# Patient Record
Sex: Female | Born: 1960
Health system: Southern US, Community
[De-identification: ages and names within clinical notes are randomized; demographics above are authoritative.]

## PROBLEM LIST (undated history)

## (undated) DIAGNOSIS — I10 Essential (primary) hypertension: Secondary | ICD-10-CM

## (undated) DIAGNOSIS — E119 Type 2 diabetes mellitus without complications: Secondary | ICD-10-CM

## (undated) DIAGNOSIS — E785 Hyperlipidemia, unspecified: Secondary | ICD-10-CM

## (undated) DIAGNOSIS — Z5189 Encounter for other specified aftercare: Secondary | ICD-10-CM

## (undated) DIAGNOSIS — M1990 Unspecified osteoarthritis, unspecified site: Secondary | ICD-10-CM

## (undated) DIAGNOSIS — F17210 Nicotine dependence, cigarettes, uncomplicated: Secondary | ICD-10-CM

## (undated) DIAGNOSIS — M4186 Other forms of scoliosis, lumbar region: Secondary | ICD-10-CM

## (undated) DIAGNOSIS — M4156 Other secondary scoliosis, lumbar region: Secondary | ICD-10-CM

## (undated) DIAGNOSIS — Z803 Family history of malignant neoplasm of breast: Secondary | ICD-10-CM

## (undated) HISTORY — DX: Encounter for other specified aftercare: Z51.89

## (undated) HISTORY — DX: Unspecified osteoarthritis, unspecified site: M19.90

## (undated) HISTORY — PX: ABDOMINAL HYSTERECTOMY: SHX81

## (undated) HISTORY — DX: Family history of malignant neoplasm of breast: Z80.3

## (undated) HISTORY — DX: Other secondary scoliosis, lumbar region: M41.56

## (undated) HISTORY — DX: Hyperlipidemia, unspecified: E78.5

## (undated) HISTORY — DX: Other forms of scoliosis, lumbar region: M41.86

## (undated) HISTORY — DX: Type 2 diabetes mellitus without complications: E11.9

---
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
## 2017-04-17 ENCOUNTER — Emergency Department (HOSPITAL_BASED_OUTPATIENT_CLINIC_OR_DEPARTMENT_OTHER)
Admission: EM | Admit: 2017-04-17 | Discharge: 2017-04-17 | Disposition: A | Discharge status: Home / Self Care | Payer: Self-pay | Attending: Emergency Medicine | Admitting: Emergency Medicine

## 2017-04-17 ENCOUNTER — Encounter (HOSPITAL_BASED_OUTPATIENT_CLINIC_OR_DEPARTMENT_OTHER): Payer: Self-pay | Admitting: Emergency Medicine

## 2017-04-17 DIAGNOSIS — S39012A Strain of muscle, fascia and tendon of lower back, initial encounter: Secondary | ICD-10-CM | POA: Insufficient documentation

## 2017-04-17 DIAGNOSIS — Y9389 Activity, other specified: Secondary | ICD-10-CM | POA: Insufficient documentation

## 2017-04-17 DIAGNOSIS — Y929 Unspecified place or not applicable: Secondary | ICD-10-CM | POA: Insufficient documentation

## 2017-04-17 DIAGNOSIS — Y990 Civilian activity done for income or pay: Secondary | ICD-10-CM | POA: Insufficient documentation

## 2017-04-17 DIAGNOSIS — X500XXA Overexertion from strenuous movement or load, initial encounter: Secondary | ICD-10-CM | POA: Insufficient documentation

## 2017-04-17 MED ORDER — KETOROLAC TROMETHAMINE 30 MG/ML IJ SOLN
30.0000 mg | Freq: Once | INTRAMUSCULAR | Status: AC
  Administered 2017-04-17: 30 mg via INTRAMUSCULAR
  Filled 2017-04-17: qty 1

## 2017-04-17 MED ORDER — DEXAMETHASONE SODIUM PHOSPHATE 10 MG/ML IJ SOLN
10.0000 mg | Freq: Once | INTRAMUSCULAR | Status: AC
  Administered 2017-04-17: 10 mg via INTRAMUSCULAR
  Filled 2017-04-17: qty 1

## 2017-04-17 MED ORDER — CYCLOBENZAPRINE HCL 10 MG PO TABS: 10.0000 mg | ORAL_TABLET | Freq: Every day | ORAL | 0 refills | Status: DC

## 2017-04-17 MED ORDER — IBUPROFEN 800 MG PO TABS: 800.0000 mg | ORAL_TABLET | Freq: Three times a day (TID) | ORAL | 0 refills | Status: DC | PRN

## 2017-04-17 MED ORDER — TRAMADOL HCL 50 MG PO TABS: 50.0000 mg | ORAL_TABLET | Freq: Four times a day (QID) | ORAL | 0 refills | Status: DC | PRN

## 2017-04-17 NOTE — ED Triage Notes (Signed)
PT presents with c/o right lower back pain going down thru buttocks.

## 2017-04-17 NOTE — ED Notes (Signed)
ED PA at BS 

## 2017-04-17 NOTE — ED Notes (Signed)
EDPA into room, prior to RN assessment, see PA notes, pending orders.   

## 2017-04-17 NOTE — ED Notes (Signed)
Alert, NAD, calm, interactive, resps e/u, speaking in clear complete sentences, no dyspnea noted, skin W&D, c/o R sided low back pain, "may have aggravated/injured lifting heavy box 5 days ago, and again this am, (denies: numbness, tingling, radiation, bowel or bladder sx, urinary sx,fever, NVD, bleeding, saddle paresthesia, sob, dizziness or visual changes). No h/o same.

## 2017-04-17 NOTE — Discharge Instructions (Signed)
Return here as needed. Follow up with your doctor. °

## 2017-04-19 NOTE — ED Provider Notes (Signed)
[facility] EM[facility]GENC[facility] DEPART[facility] Provider Note   CSN: [id number] Arrival date & time: 04/17/17  2135     History   Chief Complaint Chief Complaint  Patient presents with  . Back Pain    HPI Janice Jacobs is a 56 y.o. female.  HPI Patient presents to the emergency department with right lower back pain that started 3 days ago.  Patient states that the pain does seem to go into her right buttocks.  Patient states that certain movements and palpation make the pain worse.  She states that 3 days ago she was at work and lifted a heavy box and twisted and felt a pulling in her lower back.  Patient states that she was at work today when she lifted another box that was heavy and felt increasing pain.  Patient denies numbness weakness dizziness headache blurred vision History reviewed. No pertinent past medical history.  There are no active problems to display for this patient.   History reviewed. No pertinent surgical history.  OB History    No data available       Home Medications    Prior to Admission medications   Medication Sig Start Date End Date Taking? Authorizing Provider  cyclobenzaprine (FLEXERIL) 10 MG tablet Take 1 tablet (10 mg total) by mouth at bedtime. 04/17/17   Prateek Knipple, Harrell Gave, PA-C  ibuprofen (ADVIL,MOTRIN) 800 MG tablet Take 1 tablet (800 mg total) by mouth every 8 (eight) hours as needed. 04/17/17   Cherlyn Syring, Harrell Gave, PA-C  traMADol (ULTRAM) 50 MG tablet Take 1 tablet (50 mg total) by mouth every 6 (six) hours as needed for severe pain. 04/17/17   Dalia Heading, PA-C    Family History No family history on file.  Social History Social History   Tobacco Use  . Smoking status: Never Smoker  . Smokeless tobacco: Never Used  Substance Use Topics  . Alcohol use: No  . Drug use: No     Allergies   Patient has no known allergies.   Review of Systems Review of Systems  All other systems negative except as documented in the HPI. All  pertinent positives and negatives as reviewed in the HPI. Physical Exam Updated Vital Signs BP (!) 188/94 (BP Location: Left Arm)   Pulse 70   Temp 98.8 F (37.1 C) (Oral)   Resp 19   SpO2 100%   Physical Exam  Constitutional: She is oriented to person, place, and time. She appears well-developed and well-nourished. No distress.  HENT:  Head: Normocephalic and atraumatic.  Eyes: Pupils are equal, round, and reactive to light.  Pulmonary/Chest: Effort normal.  Musculoskeletal:       Back:  Neurological: She is alert and oriented to person, place, and time. She has normal strength. No sensory deficit. Coordination and gait normal. GCS eye subscore is 4. GCS verbal subscore is 5. GCS motor subscore is 6.  Skin: Skin is warm and dry.  Psychiatric: She has a normal mood and affect.  Nursing note and vitals reviewed.    ED Treatments / Results  Labs (all labs ordered are listed, but only abnormal results are displayed) Labs Reviewed - No data to display  EKG  EKG Interpretation None       Radiology No results found.  Procedures Procedures (including critical care time)  Medications Ordered in ED Medications  ketorolac (TORADOL) 30 MG/ML injection 30 mg (30 mg Intramuscular Given 04/17/17 2244)  dexamethasone (DECADRON) injection 10 mg (10 mg Intramuscular Given 04/17/17 2244)  Initial Impression / Assessment and Plan / ED Course  I have reviewed the triage vital signs and the nursing notes.  Pertinent labs & imaging results that were available during my care of the patient were reviewed by me and considered in my medical decision making (see chart for details).     Patient has a lumbar strain based on her physical exam and HPI.  She will be treated for this told to ice and use heat on her back told to avoid heavy lifting over the next week.  Patient agrees the plan and all questions were answered.  The patient has no neurological deficits noted on exam  Final  Clinical Impressions(s) / ED Diagnoses   Final diagnoses:  Strain of lumbar region, initial encounter    ED Discharge Orders        Ordered    ibuprofen (ADVIL,MOTRIN) 800 MG tablet  Every 8 hours PRN     04/17/17 2228    traMADol (ULTRAM) 50 MG tablet  Every 6 hours PRN     04/17/17 2228    cyclobenzaprine (FLEXERIL) 10 MG tablet  Daily at bedtime     04/17/17 2228       Dalia Heading, PA-C 04/19/17 0109    Lajean Saver, MD 04/19/17 1043

## 2017-09-18 ENCOUNTER — Encounter (HOSPITAL_BASED_OUTPATIENT_CLINIC_OR_DEPARTMENT_OTHER): Payer: Self-pay | Admitting: *Deleted

## 2017-09-18 ENCOUNTER — Other Ambulatory Visit: Payer: Self-pay

## 2017-09-18 DIAGNOSIS — K029 Dental caries, unspecified: Secondary | ICD-10-CM | POA: Insufficient documentation

## 2017-09-18 DIAGNOSIS — K0889 Other specified disorders of teeth and supporting structures: Secondary | ICD-10-CM | POA: Diagnosis present

## 2017-09-18 DIAGNOSIS — F17200 Nicotine dependence, unspecified, uncomplicated: Secondary | ICD-10-CM | POA: Insufficient documentation

## 2017-09-18 NOTE — ED Triage Notes (Signed)
Upper dental pain for "a while". States she has had difficulty eating today. She has tried home measures without relief

## 2017-09-19 ENCOUNTER — Emergency Department (HOSPITAL_BASED_OUTPATIENT_CLINIC_OR_DEPARTMENT_OTHER)
Admission: EM | Admit: 2017-09-19 | Discharge: 2017-09-19 | Disposition: A | Discharge status: Home / Self Care | Payer: Commercial Managed Care - PPO | Attending: Emergency Medicine | Admitting: Emergency Medicine

## 2017-09-19 DIAGNOSIS — K0889 Other specified disorders of teeth and supporting structures: Secondary | ICD-10-CM

## 2017-09-19 DIAGNOSIS — K029 Dental caries, unspecified: Secondary | ICD-10-CM

## 2017-09-19 MED ORDER — KETOROLAC TROMETHAMINE 60 MG/2ML IM SOLN
60.0000 mg | Freq: Once | INTRAMUSCULAR | Status: AC
  Administered 2017-09-19: 60 mg via INTRAMUSCULAR
  Filled 2017-09-19: qty 2

## 2017-09-19 MED ORDER — PENICILLIN V POTASSIUM 250 MG PO TABS
500.0000 mg | ORAL_TABLET | Freq: Once | ORAL | Status: AC
  Administered 2017-09-19: 500 mg via ORAL
  Filled 2017-09-19: qty 2

## 2017-09-19 MED ORDER — PENICILLIN V POTASSIUM 500 MG PO TABS: 500.0000 mg | ORAL_TABLET | Freq: Three times a day (TID) | ORAL | 0 refills | Status: DC

## 2017-09-19 MED ORDER — HYDROCODONE-ACETAMINOPHEN 5-325 MG PO TABS
1.0000 | ORAL_TABLET | Freq: Once | ORAL | Status: AC
  Administered 2017-09-19: 1 via ORAL
  Filled 2017-09-19: qty 1

## 2017-09-19 MED ORDER — IBUPROFEN 800 MG PO TABS: 800.0000 mg | ORAL_TABLET | Freq: Three times a day (TID) | ORAL | 0 refills | Status: DC

## 2017-09-19 NOTE — ED Provider Notes (Signed)
[facility] EMER[facility] DEPARTMENT Provider Note   CSN: [id number] Arrival date & time: 09/18/17  2301     History   Chief Complaint Chief Complaint  Patient presents with  . Dental Pain    HPI Janice Jacobs is a 57 y.o. female.  The history is provided by the patient.  Dental Pain   This is a chronic problem. The current episode started more than 1 week ago. The problem occurs constantly. The problem has been gradually worsening. The pain is mild. She has tried acetaminophen for the symptoms. The treatment provided mild relief.    History reviewed. No pertinent past medical history.  There are no active problems to display for this patient.   Past Surgical History:  Procedure Laterality Date  . ABDOMINAL HYSTERECTOMY       OB History   None      Home Medications    Prior to Admission medications   Medication Sig Start Date End Date Taking? Authorizing Provider  ibuprofen (ADVIL,MOTRIN) 800 MG tablet Take 1 tablet (800 mg total) by mouth 3 (three) times daily. 09/19/17   Channon Ambrosini, Corene Cornea, MD  penicillin v potassium (VEETID) 500 MG tablet Take 1 tablet (500 mg total) by mouth 3 (three) times daily. 09/19/17   Mellody Masri, Corene Cornea, MD    Family History No family history on file.  Social History Social History   Tobacco Use  . Smoking status: Current Every Day Smoker  . Smokeless tobacco: Never Used  Substance Use Topics  . Alcohol use: No  . Drug use: No     Allergies   Patient has no known allergies.   Review of Systems Review of Systems  All other systems reviewed and are negative.    Physical Exam Updated Vital Signs BP (!) 170/93 (BP Location: Right Arm)   Pulse 68   Temp 98 F (36.7 C) (Oral)   Resp 18   Ht 5' (1.524 m)   Wt 83.2 kg (183 lb 6.8 oz)   SpO2 100%   BMI 35.82 kg/m   Physical Exam  Constitutional: She appears well-developed and well-nourished.  HENT:  Head: Normocephalic and atraumatic.  Mouth/Throat:    Eyes:  Conjunctivae and EOM are normal.  Neck: Normal range of motion.  Cardiovascular: Normal rate and regular rhythm.  Pulmonary/Chest: No stridor. No respiratory distress.  Abdominal: Soft. She exhibits no distension.  Neurological: She is alert.  Skin: Skin is warm and dry.  Nursing note and vitals reviewed.    ED Treatments / Results  Labs (all labs ordered are listed, but only abnormal results are displayed) Labs Reviewed - No data to display  EKG None  Radiology No results found.  Procedures Procedures (including critical care time)  Medications Ordered in ED Medications  ketorolac (TORADOL) injection 60 mg (60 mg Intramuscular Given 09/19/17 0126)  HYDROcodone-acetaminophen (NORCO/VICODIN) 5-325 MG per tablet 1 tablet (1 tablet Oral Given 09/19/17 0126)  penicillin v potassium (VEETID) tablet 500 mg (500 mg Oral Given 09/19/17 0126)     Initial Impression / Assessment and Plan / ED Course  I have reviewed the triage vital signs and the nursing notes.  Pertinent labs & imaging results that were available during my care of the patient were reviewed by me and considered in my medical decision making (see chart for details).     Suspect tooth infection. Abx/nsaids/dental follow up (already working on it). No e/o drainable abscess, airway comprommise or ludwigs.   Final Clinical Impressions(s) / ED  Diagnoses   Final diagnoses:  Pain, dental  Dental caries    ED Discharge Orders        Ordered    ibuprofen (ADVIL,MOTRIN) 800 MG tablet  3 times daily     09/19/17 0119    penicillin v potassium (VEETID) 500 MG tablet  3 times daily     09/19/17 0119       Navina Wohlers, Corene Cornea, MD 09/19/17 0134

## 2017-09-19 NOTE — ED Notes (Signed)
Alert, NAD, calm, interactive, resps e/u, speaking in clear complete sentences, no dyspnea noted, skin W&D, c/o R upper first molar fx & pain. Ongoing for >2 weeks. Does not have dentist yet. No relief with ibuprofen last taken at 2000. Denies: fever, NV, dizziness or drainage.

## 2017-09-19 NOTE — ED Notes (Signed)
Steady gait to room. Alert, NAD, calm, interactive, resps e/u, speaking in clear complete sentences, no dyspnea noted.

## 2018-02-08 ENCOUNTER — Encounter (HOSPITAL_BASED_OUTPATIENT_CLINIC_OR_DEPARTMENT_OTHER): Payer: Self-pay | Admitting: Emergency Medicine

## 2018-02-08 ENCOUNTER — Other Ambulatory Visit: Payer: Self-pay

## 2018-02-08 ENCOUNTER — Emergency Department (HOSPITAL_BASED_OUTPATIENT_CLINIC_OR_DEPARTMENT_OTHER)
Admission: EM | Admit: 2018-02-08 | Discharge: 2018-02-08 | Disposition: A | Discharge status: Home / Self Care | Payer: Commercial Managed Care - PPO | Attending: Emergency Medicine | Admitting: Emergency Medicine

## 2018-02-08 DIAGNOSIS — F17210 Nicotine dependence, cigarettes, uncomplicated: Secondary | ICD-10-CM | POA: Insufficient documentation

## 2018-02-08 DIAGNOSIS — I10 Essential (primary) hypertension: Secondary | ICD-10-CM

## 2018-02-08 DIAGNOSIS — N390 Urinary tract infection, site not specified: Secondary | ICD-10-CM

## 2018-02-08 DIAGNOSIS — A599 Trichomoniasis, unspecified: Secondary | ICD-10-CM | POA: Insufficient documentation

## 2018-02-08 HISTORY — DX: Essential (primary) hypertension: I10

## 2018-02-08 LAB — URINALYSIS, ROUTINE W REFLEX MICROSCOPIC
Bilirubin Urine: NEGATIVE
Glucose, UA: NEGATIVE mg/dL
Ketones, ur: NEGATIVE mg/dL
Nitrite: NEGATIVE
Protein, ur: NEGATIVE mg/dL
Specific Gravity, Urine: 1.01 (ref 1.005–1.030)
pH: 7 (ref 5.0–8.0)

## 2018-02-08 LAB — URINALYSIS, MICROSCOPIC (REFLEX)

## 2018-02-08 MED ORDER — METRONIDAZOLE 500 MG PO TABS
2000.0000 mg | ORAL_TABLET | Freq: Once | ORAL | Status: AC
  Administered 2018-02-08: 2000 mg via ORAL
  Filled 2018-02-08: qty 4

## 2018-02-08 MED ORDER — ACETAMINOPHEN 325 MG PO TABS
650.0000 mg | ORAL_TABLET | Freq: Once | ORAL | Status: AC
  Administered 2018-02-08: 650 mg via ORAL
  Filled 2018-02-08: qty 2

## 2018-02-08 MED ORDER — CEPHALEXIN 500 MG PO CAPS: 500.0000 mg | ORAL_CAPSULE | Freq: Two times a day (BID) | ORAL | 0 refills | Status: AC

## 2018-02-08 MED FILL — CEPHALEXIN 500 MG CAPSULE: 500 | 5 days supply | Qty: 10 | Fill #0

## 2018-02-08 NOTE — Discharge Instructions (Addendum)
Your urine showed that you have a UTI and trichomonas. You have been treated for trichomonas with Flagyl in the emergency room. You do not need to take any more medication for this. For the UTI, I have prescribed Keflex (an antibiotic) for you to take for 5 days. Please take this for the full five (5) days even if you start to feel better.  Your blood pressure was elevated today. It is important that you take your blood pressure medication and establish regular medical follow-up with a PCP. I have listed a primary clinic here in the building that you can establish care with.  Take care of yourself!

## 2018-02-08 NOTE — ED Triage Notes (Signed)
Reports suprapubic pain and lower back pain x 1 month.  Taking azo which gave her some relief at first but states it is no longer providing relief.  Denies N/V/D, hematuria, dysuria.

## 2018-02-08 NOTE — ED Provider Notes (Signed)
[facility] EMERGENCY DEPARTMENT Provider Note  CSN: [id number] Arrival date & time: 02/08/18  1051    History   Chief Complaint Chief Complaint  Patient presents with  . Abdominal Pain    HPI Janice Jacobs is a 57 y.o. female with a medical history of HTN who presented to the ED for low back and lower abdominal pain x30 days. She describes dull, aching pain in suprapubic region and low back bilaterally. She states that the pain is worse when she is lying down and better when standing up. No associated changes in pain with eating, defecation or times of day. Denies fever, chills, N/V, diarrhea, constipation, melena/hematochezia, dysuria, hematuria, frequency, vaginal pain, vaginal discharge or vaginal bleeding. Patient states that she has not been sexually active in 5-6 years.She states she has tried Tylenol prior to coming to the ED which help with the pain. She has not sought medical care for this prior to today.  Past Medical History:  Diagnosis Date  . Hypertension     There are no active problems to display for this patient.   Past Surgical History:  Procedure Laterality Date  . ABDOMINAL HYSTERECTOMY       OB History   None      Home Medications    Prior to Admission medications   Medication Sig Start Date End Date Taking? Authorizing Provider  cephALEXin (KEFLEX) 500 MG capsule Take 1 capsule (500 mg total) by mouth 2 (two) times daily for 5 days. 02/08/18 02/13/18  Rad Gramling, Alvie Heidelberg I, PA-C  ibuprofen (ADVIL,MOTRIN) 800 MG tablet Take 1 tablet (800 mg total) by mouth 3 (three) times daily. 09/19/17   Mesner, Corene Cornea, MD  lisinopril (PRINIVIL,ZESTRIL) 2.5 MG tablet Take 2.5 mg by mouth daily.    [provider]  penicillin v potassium (VEETID) 500 MG tablet Take 1 tablet (500 mg total) by mouth 3 (three) times daily. 09/19/17   Mesner, Corene Cornea, MD    Family History History reviewed. No pertinent family history.  Social History Social History   Tobacco  Use  . Smoking status: Current Every Day Smoker    Packs/day: 0.50    Types: Cigarettes  . Smokeless tobacco: Never Used  Substance Use Topics  . Alcohol use: No  . Drug use: No     Allergies   Patient has no known allergies.   Review of Systems Review of Systems  Constitutional: Negative for chills and fever.  Gastrointestinal: Positive for abdominal pain. Negative for blood in stool, constipation, diarrhea, nausea, rectal pain and vomiting.  Genitourinary: Negative for difficulty urinating, dysuria, frequency, hematuria, urgency, vaginal bleeding, vaginal discharge and vaginal pain.  Musculoskeletal: Positive for back pain. Negative for gait problem and neck pain.  Skin: Negative.   Hematological: Negative.      Physical Exam Updated Vital Signs BP (!) 183/100 (BP Location: Left Arm)   Pulse 95   Temp 98.8 F (37.1 C) (Oral)   Resp 18   Ht 5' (1.524 m)   Wt 80 kg   SpO2 96%   BMI 34.45 kg/m   Physical Exam  Constitutional: She appears well-developed and well-nourished.  Cardiovascular: Normal rate, regular rhythm and normal heart sounds.  Pulmonary/Chest: Effort normal and breath sounds normal.  Abdominal: Soft. Normal appearance and bowel sounds are normal. There is tenderness in the suprapubic area.  Musculoskeletal: Normal range of motion.       Lumbar back: She exhibits tenderness. She exhibits normal range of motion, no bony tenderness and  no spasm.       Back:  Skin: Skin is warm and intact. Capillary refill takes less than 2 seconds.  Nursing note and vitals reviewed.  ED Treatments / Results  Labs (all labs ordered are listed, but only abnormal results are displayed) Labs Reviewed  URINALYSIS, ROUTINE W REFLEX MICROSCOPIC - Abnormal; Notable for the following components:      Result Value   Hgb urine dipstick TRACE (*)    Leukocytes, UA SMALL (*)    All other components within normal limits  URINALYSIS, MICROSCOPIC (REFLEX) - Abnormal; Notable for  the following components:   Bacteria, UA MANY (*)    Trichomonas, UA PRESENT (*)    All other components within normal limits    EKG None  Radiology No results found.  Procedures Procedures (including critical care time)  Medications Ordered in ED Medications  metroNIDAZOLE (FLAGYL) tablet 2,000 mg (2,000 mg Oral Given 02/08/18 1227)  acetaminophen (TYLENOL) tablet 650 mg (650 mg Oral Given 02/08/18 1227)     Initial Impression / Assessment and Plan / ED Course  Triage vital signs and the nursing notes have been reviewed.  Pertinent labs & imaging results that were available during care of the patient were reviewed and considered in medical decision making (see chart for details).  Patient presents with a 30 day history of bilateral low back and suprapubic pain. Patient is afebrile and well appearing. She has no other associated symptoms and her physical exam was grossly unremarkable except for suprapubic discomfort and right side lumbar muscle tenderness. She has no risk factors that put her at risk for STIs. History and physical not consistent with an acute abdominal pathology such as pancreatitis, diverticulitis or appendicitis. Likely GU etiology.  Clinical Course as of Feb 09 1236  Tue Feb 08, 2018  1152 UA suggestive of UTI and trichomonas.   [GM]  1228 Patient hypertensive at 183/100. She reports being prescribed antihypertensive, but states that she does not take it. She endorses not liking to follow-up with medical providers. No s/s of end organ damage that warrant further evaluation. Advised to continue taking antihypertensive.   [GM]    Clinical Course User Index [GM] Seymore Brodowski, Jonelle Sports, PA-C    Final Clinical Impressions(s) / ED Diagnoses  1. UTI. Keflex 500mg  BID x5 days prescribed. 2. Trichomonas. Flagyl 2g x1 given in the ED. 3. Hypertension. Education provided on HTN and importance of medication compliance. Advised to follow-up with PCP for further  management.  Dispo: Home. After thorough clinical evaluation, this patient is determined to be medically stable and can be safely discharged with the previously mentioned treatment and/or outpatient follow-up/referral(s). At this time, there are no other apparent medical conditions that require further screening, evaluation or treatment.   Final diagnoses:  Urinary tract infection without hematuria, site unspecified  Infection due to trichomonas  Hypertension, unspecified type    ED Discharge Orders         Ordered    cephALEXin (KEFLEX) 500 MG capsule  2 times daily     02/08/18 1237            Sanita Estrada, [place]in[place] I, PA-C 02/08/18 [place], [place], DO 02/08/18 1548

## 2018-02-15 ENCOUNTER — Telehealth: Payer: Self-pay | Admitting: Medical

## 2018-02-15 ENCOUNTER — Encounter: Payer: Self-pay | Admitting: Medical

## 2018-02-15 ENCOUNTER — Ambulatory Visit (HOSPITAL_BASED_OUTPATIENT_CLINIC_OR_DEPARTMENT_OTHER)
Admission: RE | Admit: 2018-02-15 | Discharge: 2018-02-15 | Disposition: A | Discharge status: Home / Self Care | Payer: Commercial Managed Care - PPO | Source: Ambulatory Visit | Attending: Medical | Admitting: Medical

## 2018-02-15 ENCOUNTER — Ambulatory Visit (INDEPENDENT_AMBULATORY_CARE_PROVIDER_SITE_OTHER): Payer: Commercial Managed Care - PPO | Admitting: Medical

## 2018-02-15 VITALS — BP 148/88 | HR 77 | Temp 98.6°F | Resp 16 | Ht 60.0 in | Wt 175.8 lb

## 2018-02-15 DIAGNOSIS — R8271 Bacteriuria: Secondary | ICD-10-CM

## 2018-02-15 DIAGNOSIS — I10 Essential (primary) hypertension: Secondary | ICD-10-CM

## 2018-02-15 DIAGNOSIS — F17200 Nicotine dependence, unspecified, uncomplicated: Secondary | ICD-10-CM

## 2018-02-15 DIAGNOSIS — G8929 Other chronic pain: Secondary | ICD-10-CM | POA: Insufficient documentation

## 2018-02-15 DIAGNOSIS — M545 Low back pain: Secondary | ICD-10-CM

## 2018-02-15 DIAGNOSIS — E785 Hyperlipidemia, unspecified: Secondary | ICD-10-CM

## 2018-02-15 LAB — COMPREHENSIVE METABOLIC PANEL
ALBUMIN: 4.2 g/dL (ref 3.5–5.2)
ALT: 9 U/L (ref 0–35)
AST: 10 U/L (ref 0–37)
Alkaline Phosphatase: 71 U/L (ref 39–117)
BUN: 12 mg/dL (ref 6–23)
CO2: 28 mEq/L (ref 19–32)
CREATININE: 0.75 mg/dL (ref 0.40–1.20)
Calcium: 9.6 mg/dL (ref 8.4–10.5)
Chloride: 102 mEq/L (ref 96–112)
GFR: 103.25 mL/min (ref >60.00)
GLUCOSE: 96 mg/dL (ref 70–99)
POTASSIUM: 4.8 meq/L (ref 3.5–5.1)
SODIUM: 138 meq/L (ref 135–145)
Total Bilirubin: 0.3 mg/dL (ref 0.2–1.2)
Total Protein: 7.1 g/dL (ref 6.0–8.3)

## 2018-02-15 LAB — POC URINALSYSI DIPSTICK (AUTOMATED)
Bilirubin, UA: NEGATIVE
Blood, UA: NEGATIVE
Glucose, UA: NEGATIVE
KETONES UA: NEGATIVE
Leukocytes, UA: NEGATIVE
Nitrite, UA: NEGATIVE
PH UA: 6 (ref 5.0–8.0)
PROTEIN UA: NEGATIVE
SPEC GRAV UA: 1.015 (ref 1.010–1.025)
UROBILINOGEN UA: NEGATIVE U/dL — AB

## 2018-02-15 LAB — LIPID PANEL
CHOLESTEROL: 171 mg/dL (ref 0–200)
HDL: 37.1 mg/dL — ABNORMAL LOW (ref >39.00)
LDL Cholesterol: 114 mg/dL — ABNORMAL HIGH (ref 0–99)
NONHDL: 134.15
Total CHOL/HDL Ratio: 5
Triglycerides: 99 mg/dL (ref 0.0–149.0)
VLDL: 19.8 mg/dL (ref 0.0–40.0)

## 2018-02-15 MED ORDER — ATORVASTATIN CALCIUM 10 MG PO TABS: 10.0000 mg | ORAL_TABLET | Freq: Every day | ORAL | 3 refills | Status: DC

## 2018-02-15 NOTE — Patient Instructions (Addendum)
Your bp did start to come down with your bp medication. Important to keep bp controlled all the time so take med daily. Check daily over next 10-14 days. Then will decide if need increased dose.  For high lipid hx, please get cmp and lipid panel today.  Counseled on benefit to quite smoking. Will offer med to stop when bp is better controlled.  For back pain, get xray lumbar spine. Can use salon pas lidocaine patch and tylenol. Might be able to add nsaid in future visit when bp better controlled.  For recent ED visit and bacteria in urine will get urine culture. Will not do ancillary studies as you took flagyl.  Follow up in 10-14 days or as needed

## 2018-02-15 NOTE — Addendum Note (Signed)
Addended by: Hinton Dyer on: 02/15/2018 06:08 PM   Modules accepted: Orders

## 2018-02-15 NOTE — Telephone Encounter (Signed)
poct urine and urine culture done.

## 2018-02-15 NOTE — Telephone Encounter (Signed)
I saw pt as lunch started. She gave urine poct. Can you run that and associate with bacteria in urine. Also will you do urine culture. By time she gave sample you were at lunch. I did lable and it should still be in bathroom.

## 2018-02-15 NOTE — Telephone Encounter (Signed)
Atorvastatin sent to pt pharmacy.

## 2018-02-15 NOTE — Progress Notes (Signed)
Subjective:    Patient ID: Janice Jacobs, female    DOB: 12-21-1960, 57 y.o.   MRN: [id number]  HPI  Pt in for first time.     Pt works at [employer], [place] does not exercise daily but she is on her feet all day long. 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 Pt does not drink caffeine beverage. She smokes half pack new ports. Smoking since 57 yo. Did quit when she was pregnant with 3 daughters. Pt admits does not moderate healthy diet. Eats occasional red meat. No pork.    She appears to have consistent high bp levels over past visit in epic. No cardiac or neurologic signs or symptoms.  Pt recently evaluated at the ED. She was dx with uti and trichomonas. She states no sex in past 5 years.  Pt took keflex and flagyl.  She had a lot of bacteria. But no culture was done.  She had mid lumbar back pain for about 3 months. She states 3 month ago apply icey hot but did not take any medication for this. Also was taking tylenol for pain.    Pt has history of high cholesterol. Pt given lipid medications in past. Has not been on medication for 3 years.      Review of Systems  Constitutional: Negative for chills, fatigue and fever.  Respiratory: Negative for chest tightness, shortness of breath and wheezing.   Cardiovascular: Negative for chest pain and palpitations.  Gastrointestinal: Negative for abdominal pain.  Musculoskeletal: Positive for back pain. Negative for myalgias and neck pain.       Bilateral knee pain. Rt knee pain is worst. Declines xray of knee today.   Neurological: Negative for dizziness, syncope, weakness and headaches.  Hematological: Negative for adenopathy. Does not bruise/bleed easily.  Psychiatric/Behavioral: Negative for behavioral problems and confusion.   Past Medical History:  Diagnosis Date  . Hyperlipidemia   . Hypertension      Social History   Socioeconomic History  . Marital status: Divorced    Spouse name: Not on file  . Number of children: Not  on file  . Years of education: Not on file  . Highest education level: Not on file  Occupational History  . Not on file  Social Needs  . Financial resource strain: Not on file  . Food insecurity:    Worry: Not on file    Inability: Not on file  . Transportation needs:    Medical: Not on file    Non-medical: Not on file  Tobacco Use  . Smoking status: Current Every Day Smoker    Packs/day: 0.50    Types: Cigarettes  . Smokeless tobacco: Never Used  Substance and Sexual Activity  . Alcohol use: No  . Drug use: No  . Sexual activity: Never  Lifestyle  . Physical activity:    Days per week: Not on file    Minutes per session: Not on file  . Stress: Not on file  Relationships  . Social connections:    Talks on phone: Not on file    Gets together: Not on file    Attends religious service: Not on file    Active member of club or organization: Not on file    Attends meetings of clubs or organizations: Not on file    Relationship status: Not on file  . Intimate partner violence:    Fear of current or ex partner: Not on file    Emotionally abused:  Not on file    Physically abused: Not on file    Forced sexual activity: Not on file  Other Topics Concern  . Not on file  Social History Narrative  . Not on file    Past Surgical History:  Procedure Laterality Date  . ABDOMINAL HYSTERECTOMY      History reviewed. No pertinent family history.  No Known Allergies  Current Outpatient Medications on File Prior to Visit  Medication Sig Dispense Refill  . lisinopril (PRINIVIL,ZESTRIL) 2.5 MG tablet Take 2.5 mg by mouth daily.     No current facility-administered medications on file prior to visit.     BP (!) 148/88   Pulse 77   Temp 98.6 F (37 C) (Oral)   Resp 16   Ht 5' (1.524 m)   Wt 175 lb 12.8 oz (79.7 kg)   SpO2 100%   BMI 34.33 kg/m       Objective:   Physical Exam   General Mental Status- Alert. General Appearance- Not in acute distress.    Skin General: Color- Normal Color. Moisture- Normal Moisture.  Neck Carotid Arteries- Normal color. Moisture- Normal Moisture. No carotid bruits. No JVD.  Chest and Lung Exam Auscultation: Breath Sounds:-Normal.  Cardiovascular Auscultation:Rythm- Regular. Murmurs & Other Heart Sounds:Auscultation of the heart reveals- No Murmurs.  Abdomen Inspection:-Inspeection Normal. Palpation/Percussion:Note:No mass. Palpation and Percussion of the abdomen reveal- Non Tender, Non Distended + BS, no rebound or guarding.   Neurologic Cranial Nerve exam:- CN III-XII intact(No nystagmus), symmetric smile. Strength:- 5/5 equal and symmetric strength both upper and lower extremities.  Back- mid lumbar tenderness to palpation. No pain on straight leg lift. No cva pain on palpation.  Knees- bilateral crepitus on flexion and extension.        Assessment & Plan:  Your bp did start to come down with your bp medication. Important to keep bp controlled all the time so take med daily. Check daily over next 10-14 days. Then will decide if need increased dose.  For high lipid hx, please get cmp and lipid panel today.  Counseled on benefit to quite smoking. Will offer med to stop when bp is better controlled.  For back pain, get xray lumbar spine. Can use salon pas lidocaine patch and tylenol. Might be able to add nsaid in future visit when bp better controlled.  For recent ED visit and bacteria in urine will get urine culture. Will not do ancillary studies as you took flagyl.  Follow up in 10-14 days or as needed

## 2018-02-17 LAB — URINE CULTURE
MICRO NUMBER:: 91055803
SPECIMEN QUALITY:: ADEQUATE

## 2018-02-18 ENCOUNTER — Telehealth: Payer: Self-pay | Admitting: Medical

## 2018-02-18 NOTE — Telephone Encounter (Unsigned)
Copied from [place] [id number]. Topic: Quick Communication - Rx Refill/Question >> Feb 18, 2018  4:47 PM Neva Seat wrote: atorvastatin (LIPITOR) 10 MG tablet  Out of refills - needing refills asap  [facility] [phone], [place] AT [place] [address] [place] [address] Phone: [phone] Fax: [phone]

## 2018-03-01 ENCOUNTER — Ambulatory Visit: Payer: Commercial Managed Care - PPO | Admitting: Medical

## 2018-03-01 DIAGNOSIS — Z0289 Encounter for other administrative examinations: Secondary | ICD-10-CM

## 2018-03-29 ENCOUNTER — Encounter: Payer: Self-pay | Admitting: Medical

## 2019-01-16 ENCOUNTER — Other Ambulatory Visit: Payer: Self-pay

## 2019-01-16 DIAGNOSIS — Z20822 Contact with and (suspected) exposure to covid-19: Secondary | ICD-10-CM

## 2019-01-17 LAB — NOVEL CORONAVIRUS, NAA: SARS-CoV-2, NAA: NOT DETECTED

## 2019-01-18 ENCOUNTER — Telehealth: Payer: Self-pay | Admitting: Medical

## 2019-01-18 NOTE — Telephone Encounter (Signed)
Pt was given covid-19(not detected) result/ Pt verbalized understanding

## 2019-04-12 ENCOUNTER — Encounter: Payer: Self-pay | Admitting: Adult Health Nurse Practitioner

## 2019-04-12 ENCOUNTER — Other Ambulatory Visit: Payer: Self-pay

## 2019-04-12 ENCOUNTER — Ambulatory Visit (INDEPENDENT_AMBULATORY_CARE_PROVIDER_SITE_OTHER): Payer: Commercial Managed Care - PPO

## 2019-04-12 ENCOUNTER — Ambulatory Visit: Payer: Commercial Managed Care - PPO | Admitting: Adult Health Nurse Practitioner

## 2019-04-12 VITALS — BP 153/90 | HR 105 | Temp 98.6°F | Ht 60.0 in | Wt 192.0 lb

## 2019-04-12 DIAGNOSIS — E782 Mixed hyperlipidemia: Secondary | ICD-10-CM

## 2019-04-12 DIAGNOSIS — M4186 Other forms of scoliosis, lumbar region: Secondary | ICD-10-CM | POA: Diagnosis not present

## 2019-04-12 DIAGNOSIS — I10 Essential (primary) hypertension: Secondary | ICD-10-CM

## 2019-04-12 DIAGNOSIS — F17210 Nicotine dependence, cigarettes, uncomplicated: Secondary | ICD-10-CM

## 2019-04-12 DIAGNOSIS — Z23 Encounter for immunization: Secondary | ICD-10-CM

## 2019-04-12 DIAGNOSIS — Z803 Family history of malignant neoplasm of breast: Secondary | ICD-10-CM | POA: Diagnosis not present

## 2019-04-12 DIAGNOSIS — E785 Hyperlipidemia, unspecified: Secondary | ICD-10-CM | POA: Insufficient documentation

## 2019-04-12 DIAGNOSIS — M4156 Other secondary scoliosis, lumbar region: Secondary | ICD-10-CM

## 2019-04-12 HISTORY — DX: Nicotine dependence, cigarettes, uncomplicated: F17.210

## 2019-04-12 MED ORDER — LISINOPRIL-HYDROCHLOROTHIAZIDE 20-25 MG PO TABS: 1.0000 | ORAL_TABLET | Freq: Every day | ORAL | 3 refills | Status: DC

## 2019-04-12 MED ORDER — ATORVASTATIN CALCIUM 10 MG PO TABS: 10.0000 mg | ORAL_TABLET | Freq: Every day | ORAL | 6 refills | Status: DC

## 2019-04-12 NOTE — Progress Notes (Signed)
New Patient Office Visit  Subjective:  Patient ID: Janice Jacobs, female    DOB: July 06, 1960  Age: 58 y.o. MRN: [id number]  CC:  Chief Complaint  Patient presents with  . Muscle Pain    Pt stated having muscle spasm both legs---1 year.    HPI Janice Jacobs presents to establish care and f/u on her hyperlipidemia  She is a pleasant 58 year old female who works at [employer].  Eats 2 crispy chicken legs a day.  Likes to bake and makes all other food.  She is on her feet all day so counts that as her exercise.  Noted a year ago she started gaining weight.  No activity change.  No diet change.  No unusual or abnormal stress.  She has 3 daughters  22-27.  Lives with 90 year old who is still in school.    Hyperlipidemia. She takes Lipitor daily.No problems with statin.    HTN: She is not on any htn medications.  Apparently she had tried before and only took with BP was high.  She was not aware that she was supposed to take daily.  Bps today 150s over90s.     Smokes 1/2 ppd.  Has quit in the past but has started back.  Reviewed risks as to how they pertain to lung cancer.  She is precontemplative.   Muscle Cramps/Leg Pain at night:  Intermittently.  Occurs when she moves a certain way.  Self-limited course or takes Tylenol Arthritis.      Past Medical History:  Diagnosis Date  . Hyperlipidemia   . Hypertension   . Smoking 1/2 pack a day or less 04/12/2019    Past Surgical History:  Procedure Laterality Date  . ABDOMINAL HYSTERECTOMY      No family history on file.   ROS Review of Systems   Review of Systems  Constitutional: Negative for activity change, appetite change, chills and fever.  HENT: Negative for congestion, nosebleeds, trouble swallowing and voice change.   Respiratory: Negative for cough, shortness of breath and wheezing.   Gastrointestinal: Negative for diarrhea, nausea and vomiting.  Genitourinary: Negative for difficulty urinating, dysuria, flank pain and  hematuria.  Musculoskeletal: Negative for back pain, joint swelling and neck pain.  Neurological: Negative for dizziness, speech difficulty, light-headedness and numbness.  See HPI. All other review of systems negative.    Objective:   Today's Vitals: BP (!) 153/90 (BP Location: Right Arm, Patient Position: Sitting, Cuff Size: Normal)   Pulse (!) 105   Temp 98.6 F (37 C)   Ht 5' (1.524 m)   Wt 192 lb (87.1 kg)   SpO2 97%   BMI 37.50 kg/m   Physical Exam   Physical Exam  Constitutional: Oriented to person, place, and time. Appears well-developed and well-nourished.  HENT:  Head: Normocephalic and atraumatic.  Eyes: Conjunctivae and EOM are normal.  Cardiovascular: Normal rate, regular rhythm, normal heart sounds and intact distal pulses.  No murmur heard. Pulmonary/Chest: Effort normal and breath sounds normal. No stridor. No respiratory distress. Has no wheezes.  Neurological: Is alert and oriented to person, place, and time.  Skin: Skin is warm. Capillary refill takes less than 2 seconds.  Psychiatric: Has a normal mood and affect. Behavior is normal. Judgment and thought content normal.    Imaging:   Personally reviewed lumbar xrays and showed patient.   Lateral listhesis at L3 on L4 with slight spondy at L4 on L5 and disc space narrowing at L5-S1.  Assessment & Plan:   1. Need for diphtheria-tetanus-pertussis (Tdap) vaccine   2. Smoking 1/2 pack a day or less   3. Essential hypertension   4. Family hx-breast malignancy   5. Scoliosis of lumbar region due to degenerative disease of spine in adult   6. Mixed hyperlipidemia      Orders Placed This Encounter  Procedures  . DG Chest 2 View  . MM Digital Screening  . Tdap vaccine greater than or equal to 7yo IM  . TSH  . CMP14+EGFR  . VITAMIN D 25 Hydroxy (Vit-D Deficiency, Fractures)  . Lipid panel  . CBC with Differential/Platelet  . Hemoglobin A1c      Follow-up: Return in about 4 weeks (around  05/10/2019).   Glyn Ade, NP

## 2019-04-13 LAB — CMP14+EGFR
ALT: 13 IU/L (ref 0–32)
AST: 11 IU/L (ref 0–40)
Albumin/Globulin Ratio: 1.4 (ref 1.2–2.2)
Albumin: 4.3 g/dL (ref 3.8–4.9)
Alkaline Phosphatase: 102 IU/L (ref 39–117)
BUN/Creatinine Ratio: 18 (ref 9–23)
BUN: 19 mg/dL (ref 6–24)
Bilirubin Total: <0.2 mg/dL (ref 0.0–1.2)
CO2: 24 mmol/L (ref 20–29)
Calcium: 9.4 mg/dL (ref 8.7–10.2)
Chloride: 104 mmol/L (ref 96–106)
Creatinine, Ser: 1.03 mg/dL — ABNORMAL HIGH (ref 0.57–1.00)
GFR calc Af Amer: 71 mL/min/{1.73_m2} (ref >59)
GFR calc non Af Amer: 61 mL/min/{1.73_m2} (ref >59)
Globulin, Total: 3 g/dL (ref 1.5–4.5)
Glucose: 108 mg/dL — ABNORMAL HIGH (ref 65–99)
Potassium: 4.5 mmol/L (ref 3.5–5.2)
Sodium: 141 mmol/L (ref 134–144)
Total Protein: 7.3 g/dL (ref 6.0–8.5)

## 2019-04-13 LAB — CBC WITH DIFFERENTIAL/PLATELET
Basophils Absolute: 0 10*3/uL (ref 0.0–0.2)
Basos: 0 %
EOS (ABSOLUTE): 0.3 10*3/uL (ref 0.0–0.4)
Eos: 4 %
Hematocrit: 42.2 % (ref 34.0–46.6)
Hemoglobin: 13.8 g/dL (ref 11.1–15.9)
Immature Grans (Abs): 0 10*3/uL (ref 0.0–0.1)
Immature Granulocytes: 0 %
Lymphocytes Absolute: 2.6 10*3/uL (ref 0.7–3.1)
Lymphs: 30 %
MCH: 30.7 pg (ref 26.6–33.0)
MCHC: 32.7 g/dL (ref 31.5–35.7)
MCV: 94 fL (ref 79–97)
Monocytes Absolute: 0.6 10*3/uL (ref 0.1–0.9)
Monocytes: 6 %
Neutrophils Absolute: 5.4 10*3/uL (ref 1.4–7.0)
Neutrophils: 60 %
Platelets: 291 10*3/uL (ref 150–450)
RBC: 4.5 x10E6/uL (ref 3.77–5.28)
RDW: 13.5 % (ref 11.7–15.4)
WBC: 8.9 10*3/uL (ref 3.4–10.8)

## 2019-04-13 LAB — LIPID PANEL
Chol/HDL Ratio: 6.4 ratio — ABNORMAL HIGH (ref 0.0–4.4)
Cholesterol, Total: 219 mg/dL — ABNORMAL HIGH (ref 100–199)
HDL: 34 mg/dL — ABNORMAL LOW (ref >39)
LDL Chol Calc (NIH): 128 mg/dL — ABNORMAL HIGH (ref 0–99)
Triglycerides: 320 mg/dL — ABNORMAL HIGH (ref 0–149)
VLDL Cholesterol Cal: 57 mg/dL — ABNORMAL HIGH (ref 5–40)

## 2019-04-13 LAB — VITAMIN D 25 HYDROXY (VIT D DEFICIENCY, FRACTURES): Vit D, 25-Hydroxy: 8.8 ng/mL — ABNORMAL LOW (ref 30.0–100.0)

## 2019-04-13 LAB — HEMOGLOBIN A1C
Est. average glucose Bld gHb Est-mCnc: 137 mg/dL
Hgb A1c MFr Bld: 6.4 % — ABNORMAL HIGH (ref 4.8–5.6)

## 2019-04-13 LAB — TSH: TSH: 0.502 u[IU]/mL (ref 0.450–4.500)

## 2019-05-10 ENCOUNTER — Ambulatory Visit: Payer: Commercial Managed Care - PPO | Admitting: Adult Health Nurse Practitioner

## 2019-05-10 ENCOUNTER — Other Ambulatory Visit: Payer: Self-pay

## 2019-05-10 ENCOUNTER — Encounter: Payer: Self-pay | Admitting: Adult Health Nurse Practitioner

## 2019-05-10 VITALS — BP 130/91 | HR 100 | Temp 98.8°F | Resp 16 | Ht 61.0 in | Wt 189.0 lb

## 2019-05-10 DIAGNOSIS — M4186 Other forms of scoliosis, lumbar region: Secondary | ICD-10-CM

## 2019-05-10 DIAGNOSIS — I10 Essential (primary) hypertension: Secondary | ICD-10-CM

## 2019-05-10 DIAGNOSIS — F17210 Nicotine dependence, cigarettes, uncomplicated: Secondary | ICD-10-CM | POA: Diagnosis not present

## 2019-05-10 DIAGNOSIS — E559 Vitamin D deficiency, unspecified: Secondary | ICD-10-CM

## 2019-05-10 DIAGNOSIS — E782 Mixed hyperlipidemia: Secondary | ICD-10-CM

## 2019-05-10 DIAGNOSIS — M4156 Other secondary scoliosis, lumbar region: Secondary | ICD-10-CM

## 2019-05-10 HISTORY — DX: Vitamin D deficiency, unspecified: E55.9

## 2019-05-10 MED ORDER — VITAMIN D (ERGOCALCIFEROL) 1.25 MG (50000 UNIT) PO CAPS: 50000.0000 [IU] | ORAL_CAPSULE | ORAL | 2 refills | Status: DC

## 2019-05-10 MED ORDER — ATORVASTATIN CALCIUM 20 MG PO TABS: 20.0000 mg | ORAL_TABLET | Freq: Every day | ORAL | 3 refills | Status: DC

## 2019-05-10 MED ORDER — METHYLPREDNISOLONE 4 MG PO TBPK: ORAL_TABLET | ORAL | 0 refills | Status: DC

## 2019-05-10 MED ORDER — MELOXICAM 15 MG PO TABS: 15.0000 mg | ORAL_TABLET | Freq: Every day | ORAL | 0 refills | Status: DC

## 2019-05-10 NOTE — Progress Notes (Signed)
Subjective:  Janice Jacobs is a 58 y.o. female with hyperlipidemia and recently diagnosed HTN, and Vitamin D deficiency    Last visit, was started on Lisinopril-HCTZ.  She is taking it daily.  BP readings have improved per chart review.  Not taking them at home.  She feels fine on the medication.    Vitals with BMI 05/10/2019 04/12/2019 04/12/2019  Height 5\' 1"  - 5\' 0"   Weight 189 lbs - 192 lbs  BMI [id number]  Systolic [id number]  Diastolic 91 90 87  Pulse [id number] - 105   Lipids:  Patient was not fasting for profile.  Despite this, she is a smoker with HTN, and overweight.  This increases her cardiac risk score.  Would go ahead and increase Lipitor to 20mg  qhs and recheck in 6 weeks.  She is not going to change much in the way of her nutrition. Explained the foods and exercise that could improve her HDL.      She wishes to also address some pain in the back at today's visit.  She does have a history of spondylosis and degenerative scoliosis.  Pain to the midline region of her lower back and into the SI joints bilaterally with some facet tenderness.  No radiating pain down her legs.  No difficulty with gait.  No loss of sensation.  These have been mild-to-moderate in nature, gradual in onset. Exam shows localized tenderness of lower back and reduced range of motion of spine. . These pains seem benign and are likely related to osteoarthritis and spondylosis. OTC or prescription NSAID's are recommended for PRN use, side effects are discussed. Return for further discussion if these persist or worsen.  Vitamin D deficiency: Patient's labs were found to have very low vitamin D of 8.8.  Reviewed smoking side effects on bone and vitamin D.  Reviewed need to increase weightbearing activity.  She is amenable to starting 50,000 units weekly for 3 months and then we will recheck  TSH was borderline low and hemoglobin A1c was elevated to 6.2%.  At this time, the patient does not want to treat and we will  repeat with next labs in 6 weeks.  For her smoking, she is not yet ready to quit.  We discussed this briefly.  Discussed how it would greatly improve her cardiovascular risk.  She verbalized understanding..  Current Outpatient Medications  Medication Sig Dispense Refill  . lisinopril-hydrochlorothiazide (ZESTORETIC) 20-25 MG tablet Take 1 tablet by mouth daily. 30 tablet 3  . atorvastatin (LIPITOR) 20 MG tablet Take 1 tablet (20 mg total) by mouth daily. 90 tablet 3  . meloxicam (MOBIC) 15 MG tablet Take 1 tablet (15 mg total) by mouth daily. 30 tablet 0  . methylPREDNISolone (MEDROL DOSEPAK) 4 MG TBPK tablet As directed on pkg label 21 each 0  . Vitamin D, Ergocalciferol, (DRISDOL) 1.25 MG (50000 UT) CAPS capsule Take 1 capsule (50,000 Units total) by mouth every 7 (seven) days. 5 capsule 2   No current facility-administered medications for this visit.     Cardiovascular risk analysis - 58 y.o. female LDL goal is under 100 existing CAD hypertension hyperlipidemia low HDL smoker family history of premature CAD obese sedentary lifestyle.  ROS: taking medications as instructed, no medication side effects noted, patient does not perform home BP monitoring, no TIA's, no chest pain on exertion, no dyspnea on exertion and no swelling of ankles.  New concerns: .   Objective:  BP (!) 130/91   Pulse  100   Temp 98.8 F (37.1 C) (Oral)   Resp 16   Ht 5\' 1"  (1.549 m)   Wt 189 lb (85.7 kg)   SpO2 96%   BMI 35.71 kg/m   Appearance alert, well appearing, and in no distress. General exam BP noted to be mildly elevated today in office, S1, S2 normal, no gallop, no murmur, chest clear, no JVD, no HSM, no edema.  Musculoskeletal: Pain to facet joints bilaterally with some radiation into the SI joint more on the right than on the left.  Tender to touch.  She is somewhat kyphotic.  Limited range of motion with flexion extension and lateral bending.  No loss of strength in the lower extremities 5  out of 5 lower extremity strength symmetric and equal bilaterally Lab review: labs reviewed, I note that glycosylated hemoglobin normal, mildly abnormal but acceptable, lipids LDL result meets goal, HDL low, triglycerides high, liver functions are normal, renal functions normal, creatinine mildly elevated, , most recent lipid panel reviewed, showing LDL result does not yet meet goal, HDL low, triglycerides high, liver functions are normal.   Assessment:   Hyperlipidemia needs improvement and needs to quit smoking.  1. Mixed hyperlipidemia   2. Essential hypertension   3. Smoking 1/2 pack a day or less   4. Scoliosis of lumbar region due to degenerative disease of spine in adult   5. Vitamin D deficiency    Meds ordered this encounter  Medications  . Vitamin D, Ergocalciferol, (DRISDOL) 1.25 MG (50000 UT) CAPS capsule    Sig: Take 1 capsule (50,000 Units total) by mouth every 7 (seven) days.    Dispense:  5 capsule    Refill:  2  . atorvastatin (LIPITOR) 20 MG tablet    Sig: Take 1 tablet (20 mg total) by mouth daily.    Dispense:  90 tablet    Refill:  3  . methylPREDNISolone (MEDROL DOSEPAK) 4 MG TBPK tablet    Sig: As directed on pkg label    Dispense:  21 each    Refill:  0  . meloxicam (MOBIC) 15 MG tablet    Sig: Take 1 tablet (15 mg total) by mouth daily.    Dispense:  30 tablet    Refill:  0   Instructed not to take the Medrol Dosepak and the meloxicam together.  She may take the Olivia, and then after completed start the meloxicam.  Have advised she will need food on her stomach in order to have medication be effective.  She verbalized understanding.  Plan:  Orders and follow up as documented in patient record. Reviewed diet, exercise and weight control. Very strongly urged to quit smoking to reduce cardiovascular risk. Cardiovascular risk and specific lipid/LDL goals reviewed. Reviewed potential future medication changes and side effects. The following changes are to  be made: Increase Lipitor to 20mg  qhs  Follow up: 6 weeks and as needed.Marland Kitchen

## 2019-05-10 NOTE — Patient Instructions (Addendum)

## 2019-05-24 ENCOUNTER — Telehealth: Payer: Self-pay | Admitting: Medical

## 2019-05-24 ENCOUNTER — Other Ambulatory Visit: Payer: Self-pay | Admitting: Adult Health Nurse Practitioner

## 2019-05-24 NOTE — Telephone Encounter (Signed)
Requested medication (s) are due for refill today: yes  Requested medication (s) are on the active medication list: yes  Last refill:  05/10/2019  Future visit scheduled: yes  Notes to clinic: Not delegated     Requested Prescriptions  Pending Prescriptions Disp Refills   methylPREDNISolone (MEDROL DOSEPAK) 4 MG TBPK tablet 21 each 0    Sig: As directed on pkg label     Not Delegated - Endocrinology:  Oral Corticosteroids Failed - 05/24/2019  4:19 PM      Failed - This refill cannot be delegated      Failed - Last BP in normal range    BP Readings from Last 1 Encounters:  05/10/19 (!) 130/91         Passed - Valid encounter within last 6 months    Recent Outpatient Visits          2 weeks ago Mixed hyperlipidemia   [facility] at [facility], Lorelee Market, NP   1 month ago Need for diphtheria-tetanus-pertussis (Tdap) vaccine   [facility] at [facility], Lorelee Market, NP   1 year ago Essential hypertension   [facility] [place], [place]      Future Appointments            In 2 weeks Felton Clinton, Lorelee Market, NP Primary Care at [place], [facility]

## 2019-05-24 NOTE — Telephone Encounter (Signed)
Error

## 2019-05-24 NOTE — Telephone Encounter (Signed)
Medication refill: methylPREDNISolone (MEDROL DOSEPAK) 4 MG TBPK tablet [id number]    Pharmacy:  [facility] [phone] [address] [place], [place] [phone] (Phone) [phone] (Fax)     Pt states that she is still having back pain. Please advise

## 2019-05-25 MED ORDER — METHYLPREDNISOLONE 4 MG PO TBPK: ORAL_TABLET | ORAL | 0 refills | Status: DC

## 2019-05-26 NOTE — Telephone Encounter (Signed)
Methylpredisolone 4 mg dose pack #21 filled 05/25/2019.

## 2019-06-05 IMAGING — DX DG LUMBAR SPINE 2-3V
3 series · 3 of 3 positions shown · non-contrast
Comparison: None.

CLINICAL DATA: 56-year-old female with lower back pain for 2-3
months. No injury. Initial encounter.

EXAM:
LUMBAR SPINE - 2-3 VIEW

[l-spine ap]
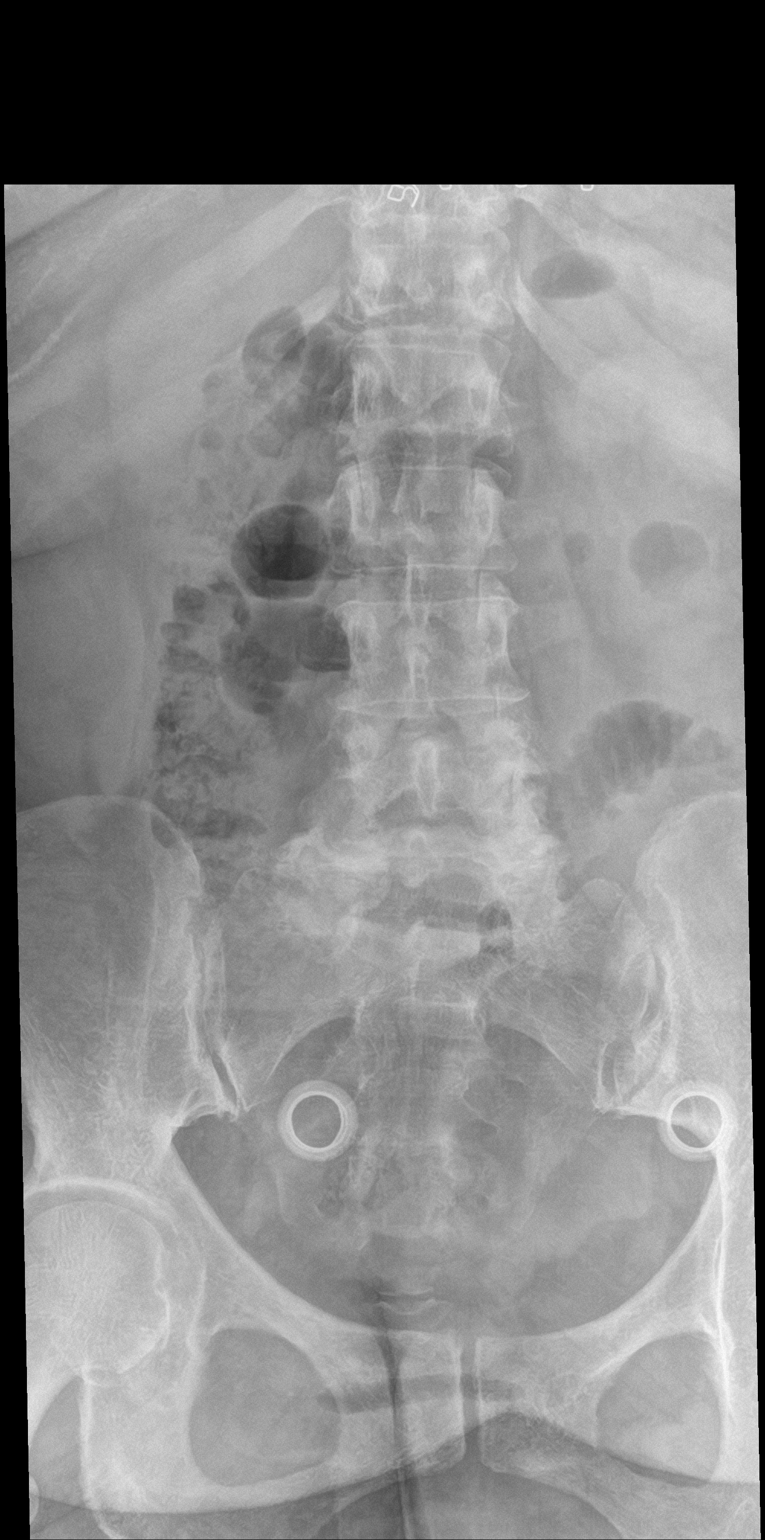

[l-spine lat]
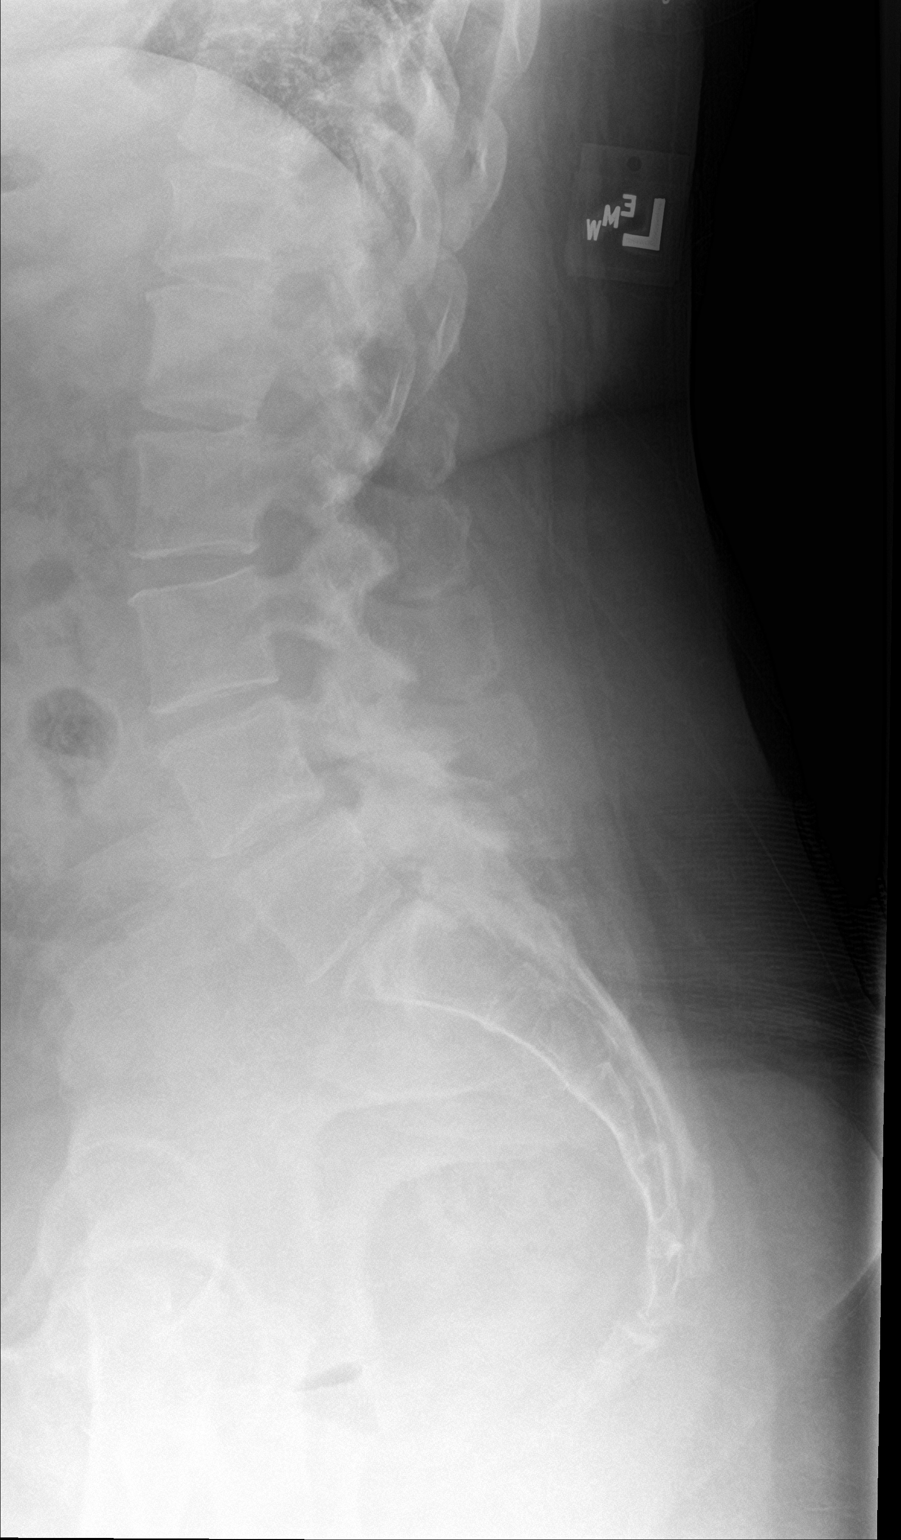

[l-spine spot]
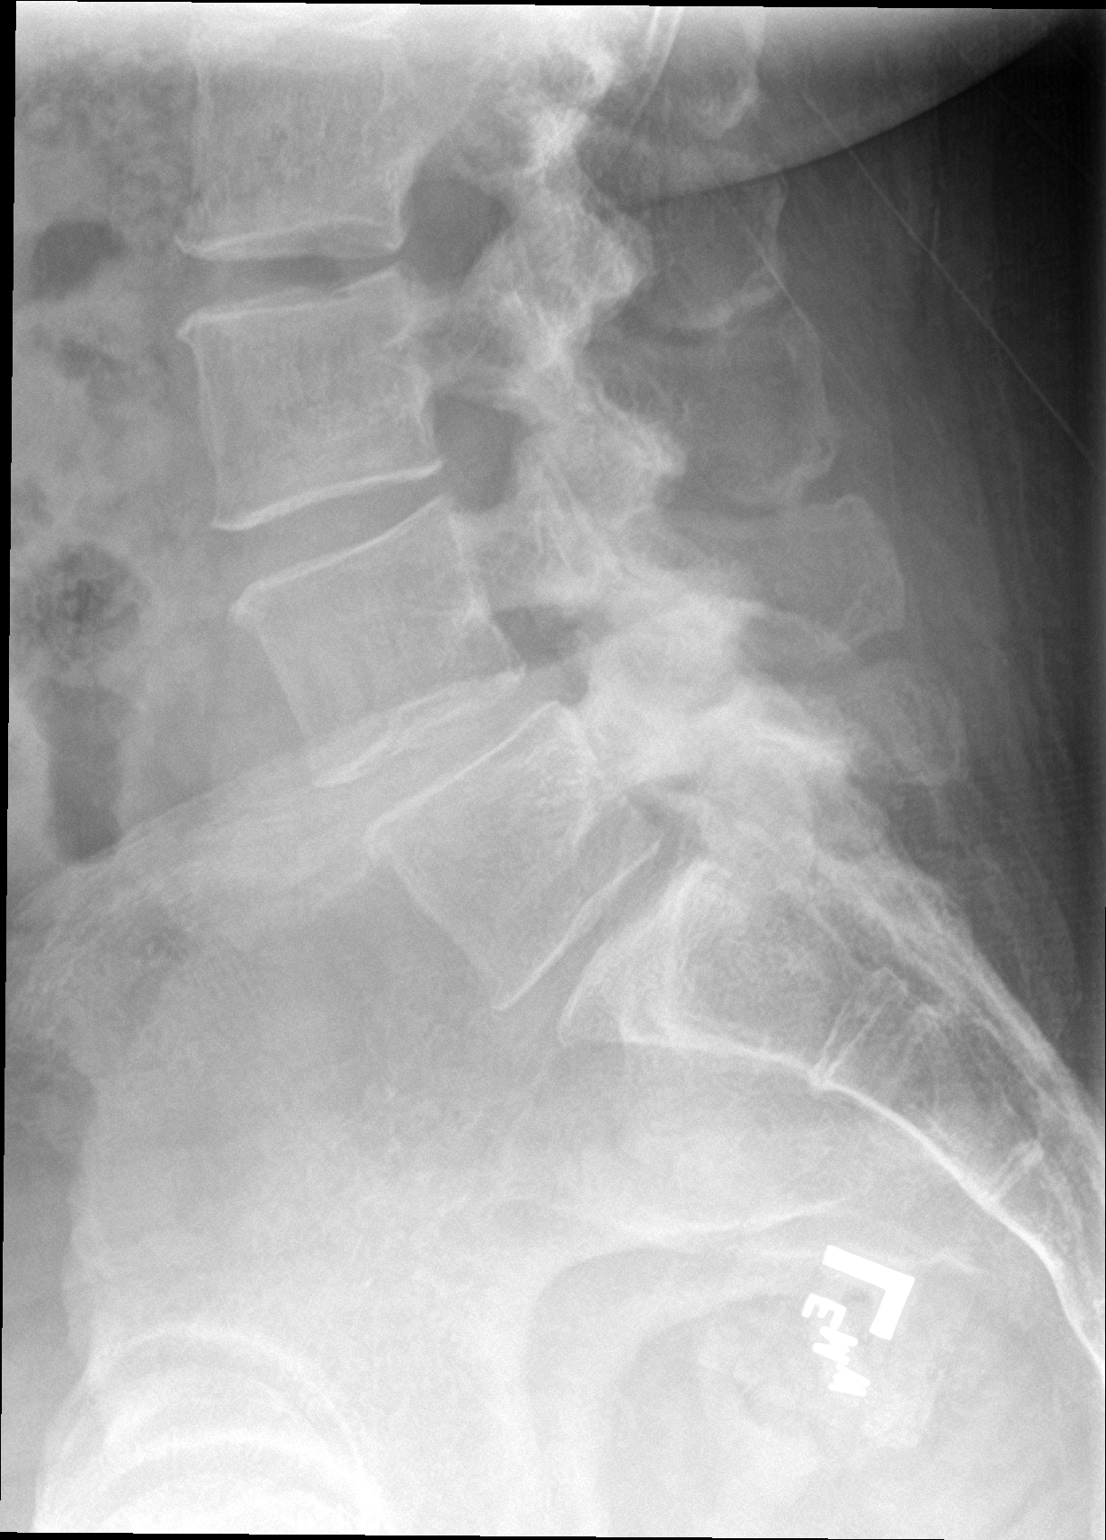

[3 of 3 positions shown; findings below may reference images not displayed]

FINDINGS: Minimal curvature lumbar spine convex right. Minimal L3-4 disc space
narrowing. 2 mm anterior slip L4 secondary to facet degenerative
changes. Minimal Schmorl's node deformity inferior endplate L4.
Minimal L4-5 disc space narrowing. Minimal anterior slip L5
secondary to facet degenerative changes. Mild L5-S1 disc space
narrowing. No obvious pars defect (oblique views not obtained). No
compression fracture.
IMPRESSION: 1. Minimal anterior slip L4 and L5 secondary to facet degenerative
changes.
2. Minimal L3-4 and L4-5 disc space narrowing. Mild L5-S1 disc space
narrowing.

## 2019-06-07 ENCOUNTER — Ambulatory Visit: Payer: Commercial Managed Care - PPO | Admitting: Adult Health Nurse Practitioner

## 2019-06-14 ENCOUNTER — Ambulatory Visit: Payer: Commercial Managed Care - PPO | Admitting: Adult Health Nurse Practitioner

## 2019-06-15 ENCOUNTER — Other Ambulatory Visit: Payer: Self-pay | Admitting: Adult Health Nurse Practitioner

## 2019-06-15 NOTE — Telephone Encounter (Signed)
Pt requests refill on bp med and arthritis med. She missed her appt on 12/30. Came in office on 12/31 and made an appt for 06/21/19

## 2019-06-20 ENCOUNTER — Other Ambulatory Visit: Payer: Self-pay | Admitting: Adult Health Nurse Practitioner

## 2019-06-20 MED ORDER — MELOXICAM 15 MG PO TABS: 15.0000 mg | ORAL_TABLET | Freq: Every day | ORAL | 0 refills | Status: DC

## 2019-06-20 MED ORDER — LISINOPRIL-HYDROCHLOROTHIAZIDE 20-25 MG PO TABS: 1.0000 | ORAL_TABLET | Freq: Every day | ORAL | 3 refills | Status: DC

## 2019-06-20 NOTE — Telephone Encounter (Signed)
Sent in to pharmacy.  

## 2019-06-21 ENCOUNTER — Other Ambulatory Visit: Payer: Self-pay

## 2019-06-21 ENCOUNTER — Encounter: Payer: Self-pay | Admitting: Gastroenterology

## 2019-06-21 ENCOUNTER — Ambulatory Visit (INDEPENDENT_AMBULATORY_CARE_PROVIDER_SITE_OTHER): Payer: Commercial Managed Care - PPO | Admitting: Adult Health Nurse Practitioner

## 2019-06-21 VITALS — BP 140/80 | HR 82 | Temp 98.0°F | Ht 61.0 in | Wt 196.8 lb

## 2019-06-21 DIAGNOSIS — Z124 Encounter for screening for malignant neoplasm of cervix: Secondary | ICD-10-CM

## 2019-06-21 DIAGNOSIS — E559 Vitamin D deficiency, unspecified: Secondary | ICD-10-CM

## 2019-06-21 DIAGNOSIS — M4186 Other forms of scoliosis, lumbar region: Secondary | ICD-10-CM

## 2019-06-21 DIAGNOSIS — E669 Obesity, unspecified: Secondary | ICD-10-CM

## 2019-06-21 DIAGNOSIS — I10 Essential (primary) hypertension: Secondary | ICD-10-CM

## 2019-06-21 DIAGNOSIS — Z1211 Encounter for screening for malignant neoplasm of colon: Secondary | ICD-10-CM

## 2019-06-21 DIAGNOSIS — Z803 Family history of malignant neoplasm of breast: Secondary | ICD-10-CM

## 2019-06-21 DIAGNOSIS — M4156 Other secondary scoliosis, lumbar region: Secondary | ICD-10-CM

## 2019-06-21 DIAGNOSIS — F17210 Nicotine dependence, cigarettes, uncomplicated: Secondary | ICD-10-CM

## 2019-06-21 DIAGNOSIS — E782 Mixed hyperlipidemia: Secondary | ICD-10-CM

## 2019-06-21 MED ORDER — LISINOPRIL-HYDROCHLOROTHIAZIDE 20-25 MG PO TABS: 1.0000 | ORAL_TABLET | Freq: Every day | ORAL | 3 refills | Status: DC

## 2019-06-21 MED ORDER — MELOXICAM 15 MG PO TABS: 15.0000 mg | ORAL_TABLET | Freq: Every day | ORAL | 0 refills | Status: DC

## 2019-06-21 MED ORDER — VITAMIN D (ERGOCALCIFEROL) 1.25 MG (50000 UNIT) PO CAPS: 50000.0000 [IU] | ORAL_CAPSULE | ORAL | 2 refills | Status: DC

## 2019-06-21 MED ORDER — METHYLPREDNISOLONE 4 MG PO TBPK: ORAL_TABLET | ORAL | 0 refills | Status: DC

## 2019-06-21 NOTE — Progress Notes (Signed)
Chief Complaint  Patient presents with  . Follow-up    x6 wks    HPI   Patient is here today to follow-up on her blood pressure.  Also discussed tobacco use.  She is interested in medical weight management and would like follow-up or referral to medical weight management.  In addition needs blood tests and is fasting today.  Problem List    Problem List: 2020-11: Vitamin D deficiency 2020-10: Smoking 1/2 pack a day or less Hypertension Scoliosis of lumbar region due to degenerative disease of spine in  adult Hyperlipidemia Family hx-breast malignancy   Allergies   has No Known Allergies.  Medications    Current Outpatient Medications:  .  atorvastatin (LIPITOR) 20 MG tablet, Take 1 tablet (20 mg total) by mouth daily., Disp: 90 tablet, Rfl: 3 .  lisinopril-hydrochlorothiazide (ZESTORETIC) 20-25 MG tablet, Take 1 tablet by mouth daily., Disp: 30 tablet, Rfl: 3 .  meloxicam (MOBIC) 15 MG tablet, Take 1 tablet (15 mg total) by mouth daily., Disp: 30 tablet, Rfl: 0 .  methylPREDNISolone (MEDROL DOSEPAK) 4 MG TBPK tablet, As directed on pkg label, Disp: 21 each, Rfl: 0 .  Vitamin D, Ergocalciferol, (DRISDOL) 1.25 MG (50000 UT) CAPS capsule, Take 1 capsule (50,000 Units total) by mouth every 7 (seven) days., Disp: 5 capsule, Rfl: 2   Review of Systems    Constitutional: Negative for activity change, appetite change, chills and fever.  HENT: Negative for congestion, nosebleeds, trouble swallowing and voice change.   Respiratory: Negative for cough, shortness of breath and wheezing.   Gastrointestinal: Negative for diarrhea, nausea and vomiting.  Genitourinary: Negative for difficulty urinating, dysuria, flank pain and hematuria.  Musculoskeletal: Negative for back pain, joint swelling and neck pain.  Neurological: Negative for dizziness, speech difficulty, light-headedness and numbness.  See HPI. All other review of systems negative.     Physical Exam:   Physical  Examination: General appearance - alert, well appearing, and in no distress and oriented to person, place, and time Mental status - normal mood, behavior, speech, dress, motor activity, and thought processes Neck - supple, no significant adenopathy, carotids upstroke normal bilaterally, no bruits, thyroid exam: thyroid is normal in size without nodules or tenderness Chest - clear to auscultation, no wheezes, rales or rhonchi, symmetric air entry  Heart - normal rate, regular rhythm, normal S1, S2, no murmurs, rubs, clicks or gallops Extremities - dependent LE edema without clubbing or cyanosis Skin - normal coloration and turgor, no rashes, no suspicious skin lesions noted  No hyperpigmentation of skin.  No current hematomas noted   Lab Review   orders written for new lab studies as appropriate; see orders.   Assessment & Plan:  Janice Jacobs is a 59 y.o. female . 1. Screening for colon cancer   2. Screening for cervical cancer   3. Essential hypertension   4. Smoking 1/2 pack a day or less   5. Mixed hyperlipidemia   6. Family hx-breast malignancy   7. Vitamin D deficiency   8. Scoliosis of lumbar region due to degenerative disease of spine in adult   9. Class 1 obesity without serious comorbidity in adult, unspecified BMI, unspecified obesity type    Meds ordered this encounter  Medications  . lisinopril-hydrochlorothiazide (ZESTORETIC) 20-25 MG tablet    Sig: Take 1 tablet by mouth daily.    Dispense:  30 tablet    Refill:  3  . DISCONTD: meloxicam (MOBIC) 15 MG tablet    Sig: Take  1 tablet (15 mg total) by mouth daily.    Dispense:  30 tablet    Refill:  0  . DISCONTD: Vitamin D, Ergocalciferol, (DRISDOL) 1.25 MG (50000 UT) CAPS capsule    Sig: Take 1 capsule (50,000 Units total) by mouth every 7 (seven) days.    Dispense:  5 capsule    Refill:  2  . DISCONTD: methylPREDNISolone (MEDROL DOSEPAK) 4 MG TBPK tablet    Sig: As directed on pkg label    Dispense:  21 each     Refill:  0   Orders Placed This Encounter  Procedures  . CT CHEST LUNG CA SCREEN LOW DOSE W/O CM  . MM Digital Screening  . Comprehensive metabolic panel  . VITAMIN D 25 Hydroxy (Vit-D Deficiency, Fractures)  . Ambulatory referral to Gastroenterology  . Amb Ref to Medical Weight Management     Glyn Ade, NP

## 2019-06-21 NOTE — Patient Instructions (Signed)
° ° ° °  If you have lab work done today you will be contacted with your lab results within the next 2 weeks.  If you have not heard from us then please contact us. The fastest way to get your results is to register for My Chart. ° ° °IF you received an x-ray today, you will receive an invoice from [facility]. Please contact [facility] at [phone] with questions or concerns regarding your invoice.  ° °IF you received labwork today, you will receive an invoice from [facility]. Please contact [facility] at [phone] with questions or concerns regarding your invoice.  ° °Our billing staff will not be able to assist you with questions regarding bills from these companies. ° °You will be contacted with the lab results as soon as they are available. The fastest way to get your results is to activate your My Chart account. Instructions are located on the last page of this paperwork. If you have not heard from us regarding the results in 2 weeks, please contact this office. °  ° ° ° °

## 2019-06-22 LAB — COMPREHENSIVE METABOLIC PANEL
ALT: 13 IU/L (ref 0–32)
AST: 15 IU/L (ref 0–40)
Albumin/Globulin Ratio: 1.7 (ref 1.2–2.2)
Albumin: 4.3 g/dL (ref 3.8–4.9)
Alkaline Phosphatase: 100 IU/L (ref 39–117)
BUN/Creatinine Ratio: 15 (ref 9–23)
BUN: 17 mg/dL (ref 6–24)
Bilirubin Total: <0.2 mg/dL (ref 0.0–1.2)
CO2: 21 mmol/L (ref 20–29)
Calcium: 8.5 mg/dL — ABNORMAL LOW (ref 8.7–10.2)
Chloride: 104 mmol/L (ref 96–106)
Creatinine, Ser: 1.1 mg/dL — ABNORMAL HIGH (ref 0.57–1.00)
GFR calc Af Amer: 65 mL/min/{1.73_m2} (ref >59)
GFR calc non Af Amer: 56 mL/min/{1.73_m2} — ABNORMAL LOW (ref >59)
Globulin, Total: 2.5 g/dL (ref 1.5–4.5)
Glucose: 105 mg/dL — ABNORMAL HIGH (ref 65–99)
Potassium: 5 mmol/L (ref 3.5–5.2)
Sodium: 140 mmol/L (ref 134–144)
Total Protein: 6.8 g/dL (ref 6.0–8.5)

## 2019-06-22 LAB — VITAMIN D 25 HYDROXY (VIT D DEFICIENCY, FRACTURES): Vit D, 25-Hydroxy: 27.6 ng/mL — ABNORMAL LOW (ref 30.0–100.0)

## 2019-07-05 ENCOUNTER — Ambulatory Visit (AMBULATORY_SURGERY_CENTER): Payer: Self-pay | Admitting: *Deleted

## 2019-07-05 ENCOUNTER — Other Ambulatory Visit: Payer: Self-pay

## 2019-07-05 VITALS — Temp 97.5°F | Ht 61.0 in | Wt 197.0 lb

## 2019-07-05 DIAGNOSIS — Z01818 Encounter for other preprocedural examination: Secondary | ICD-10-CM

## 2019-07-05 DIAGNOSIS — Z1211 Encounter for screening for malignant neoplasm of colon: Secondary | ICD-10-CM

## 2019-07-05 MED ORDER — NA SULFATE-K SULFATE-MG SULF 17.5-3.13-1.6 GM/177ML PO SOLN: ORAL | 0 refills | Status: DC

## 2019-07-05 NOTE — Progress Notes (Signed)
Patient is here in-person for PV. Patient denies any allergies to eggs or soy. Patient denies any problems with anesthesia/sedation. Patient denies any oxygen use at home. Patient denies taking any diet/weight loss medications or blood thinners. Patient is not being treated for MRSA or C-diff. EMMI education assisgned to the patient for the procedure, this was explained and instructions given to patient. COVID-19 screening test is on 1/29, the pt is aware. Pt is aware that care partner will wait in the car during procedure; if they feel like they will be too hot or cold to wait in the car; they may wait in the 4 th floor lobby. Patient is aware to bring only one care partner. We want them to wear a mask (we do not have any that we can provide them), practice social distancing, and we will check their temperatures when they get here.  I did remind the patient that their care partner needs to stay in the parking lot the entire time and have a cell phone available, we will call them when the pt is ready for discharge. Patient will wear mask into building.    Suprep $15 off coupon given to the patient.

## 2019-07-12 ENCOUNTER — Encounter: Payer: Self-pay | Admitting: Gastroenterology

## 2019-07-14 ENCOUNTER — Other Ambulatory Visit: Payer: Self-pay

## 2019-07-14 ENCOUNTER — Ambulatory Visit (INDEPENDENT_AMBULATORY_CARE_PROVIDER_SITE_OTHER): Payer: Self-pay

## 2019-07-14 ENCOUNTER — Other Ambulatory Visit: Payer: Self-pay | Admitting: Gastroenterology

## 2019-07-14 DIAGNOSIS — Z1159 Encounter for screening for other viral diseases: Secondary | ICD-10-CM

## 2019-07-17 LAB — SARS CORONAVIRUS 2 (TAT 6-24 HRS): SARS Coronavirus 2: NEGATIVE

## 2019-07-19 ENCOUNTER — Encounter: Payer: Self-pay | Admitting: Gastroenterology

## 2019-07-19 ENCOUNTER — Ambulatory Visit (AMBULATORY_SURGERY_CENTER): Payer: Commercial Managed Care - PPO | Admitting: Gastroenterology

## 2019-07-19 ENCOUNTER — Other Ambulatory Visit: Payer: Self-pay

## 2019-07-19 VITALS — BP 167/99 | HR 74 | Temp 96.8°F | Resp 17 | Ht 61.0 in | Wt 197.0 lb

## 2019-07-19 DIAGNOSIS — D127 Benign neoplasm of rectosigmoid junction: Secondary | ICD-10-CM | POA: Diagnosis not present

## 2019-07-19 DIAGNOSIS — D125 Benign neoplasm of sigmoid colon: Secondary | ICD-10-CM

## 2019-07-19 DIAGNOSIS — Z1211 Encounter for screening for malignant neoplasm of colon: Secondary | ICD-10-CM

## 2019-07-19 DIAGNOSIS — D128 Benign neoplasm of rectum: Secondary | ICD-10-CM

## 2019-07-19 DIAGNOSIS — D122 Benign neoplasm of ascending colon: Secondary | ICD-10-CM

## 2019-07-19 DIAGNOSIS — K635 Polyp of colon: Secondary | ICD-10-CM

## 2019-07-19 DIAGNOSIS — K621 Rectal polyp: Secondary | ICD-10-CM | POA: Diagnosis not present

## 2019-07-19 DIAGNOSIS — D124 Benign neoplasm of descending colon: Secondary | ICD-10-CM

## 2019-07-19 MED ORDER — SODIUM CHLORIDE 0.9 % IV SOLN: 500.0000 mL | Freq: Once | INTRAVENOUS | Status: DC

## 2019-07-19 NOTE — Progress Notes (Signed)
Report to PACU, RN, vss, BBS= Clear.  

## 2019-07-19 NOTE — Progress Notes (Signed)
Called to room to assist during endoscopic procedure.  Patient ID and intended procedure confirmed with present staff. Received instructions for my participation in the procedure from the performing physician.  

## 2019-07-19 NOTE — Op Note (Signed)
[place] Patient Name: Janice Jacobs Procedure Date: 07/19/2019 9:30 AM MRN: [id number] Endoscopist: Ladene Artist , MD Age: 59 Referring MD:  Date of Birth: [birth year] Gender: Female Account #: [id number] Procedure:                Colonoscopy Indications:              Screening for colorectal malignant neoplasm Medicines:                Monitored Anesthesia Care Procedure:                Pre-Anesthesia Assessment:                           - Prior to the procedure, a History and Physical                            was performed, and patient medications and                            allergies were reviewed. The patient's tolerance of                            previous anesthesia was also reviewed. The risks                            and benefits of the procedure and the sedation                            options and risks were discussed with the patient.                            All questions were answered, and informed consent                            was obtained. Prior Anticoagulants: The patient has                            taken no previous anticoagulant or antiplatelet                            agents. ASA Grade Assessment: II - A patient with                            mild systemic disease. After reviewing the risks                            and benefits, the patient was deemed in                            satisfactory condition to undergo the procedure.                           After obtaining informed consent, the colonoscope  was passed under direct vision. Throughout the                            procedure, the patient's blood pressure, pulse, and                            oxygen saturations were monitored continuously. The                            Colonoscope was introduced through the anus and                            advanced to the the cecum, identified by                            appendiceal orifice and  ileocecal valve. The                            ileocecal valve, appendiceal orifice, and rectum                            were photographed. The quality of the bowel                            preparation was good. The colonoscopy was performed                            without difficulty. The patient tolerated the                            procedure well. Scope In: 9:34:59 AM Scope Out: 9:55:13 AM Scope Withdrawal Time: 0 hours 18 minutes 30 seconds  Total Procedure Duration: 0 hours 20 minutes 14 seconds  Findings:                 The perianal and digital rectal examinations were                            normal.                           Twenty-one sessile polyps were found in the rectum                            (4), sigmoid colon (11), descending colon (5) and                            ascending colon (1). The polyps were 4 to 8 mm in                            size. These polyps were removed with a cold snare.                            Resection and retrieval were complete.  Internal hemorrhoids were found during                            retroflexion. The hemorrhoids were small and Grade                            I (internal hemorrhoids that do not prolapse).                           The exam was otherwise without abnormality on                            direct and retroflexion views. Complications:            No immediate complications. Estimated blood loss:                            None. Estimated Blood Loss:     Estimated blood loss: none. Impression:               - Twenty-one 4 to 8 mm polyps in the rectum, in the                            sigmoid colon, in the descending colon and in the                            ascending colon, removed with a cold snare.                            Resected and retrieved.                           - Internal hemorrhoids.                           - The examination was otherwise normal on direct                             and retroflexion views. Recommendation:           - Repeat colonoscopy after studies are complete for                            surveillance based on pathology results.                           - Patient has a contact number available for                            emergencies. The signs and symptoms of potential                            delayed complications were discussed with the                            patient. Return to normal activities tomorrow.  Written discharge instructions were provided to the                            patient.                           - Resume previous diet.                           - Continue present medications.                           - Await pathology results. Ladene Artist, MD 07/19/2019 10:02:34 AM This report has been signed electronically.

## 2019-07-19 NOTE — Progress Notes (Signed)
VS- [facility] Temperature- June Bullock  Cell phone off per pt  Pt's states no medical or surgical changes since previsit or office visit.  Josh Monday CRNA made aware that pt drank 8 oz of water at 730

## 2019-07-19 NOTE — Patient Instructions (Signed)
Please read handouts provided. Continue present medications. Await pathology results.        YOU HAD AN ENDOSCOPIC PROCEDURE TODAY AT THE  ENDOSCOPY CENTER:   Refer to the procedure report that was given to you for any specific questions about what was found during the examination.  If the procedure report does not answer your questions, please call your gastroenterologist to clarify.  If you requested that your care partner not be given the details of your procedure findings, then the procedure report has been included in a sealed envelope for you to review at your convenience later.  YOU SHOULD EXPECT: Some feelings of bloating in the abdomen. Passage of more gas than usual.  Walking can help get rid of the air that was put into your GI tract during the procedure and reduce the bloating. If you had a lower endoscopy (such as a colonoscopy or flexible sigmoidoscopy) you may notice spotting of blood in your stool or on the toilet paper. If you underwent a bowel prep for your procedure, you may not have a normal bowel movement for a few days.  Please Note:  You might notice some irritation and congestion in your nose or some drainage.  This is from the oxygen used during your procedure.  There is no need for concern and it should clear up in a day or so.  SYMPTOMS TO REPORT IMMEDIATELY:   Following lower endoscopy (colonoscopy or flexible sigmoidoscopy):  Excessive amounts of blood in the stool  Significant tenderness or worsening of abdominal pains  Swelling of the abdomen that is new, acute  Fever of 100F or higher    For urgent or emergent issues, a gastroenterologist can be reached at any hour by calling [phone].   DIET:  We do recommend a small meal at first, but then you may proceed to your regular diet.  Drink plenty of fluids but you should avoid alcoholic beverages for 24 hours.  ACTIVITY:  You should plan to take it easy for the rest of today and you should NOT  DRIVE or use heavy machinery until tomorrow (because of the sedation medicines used during the test).    FOLLOW UP: Our staff will call the number listed on your records 48-72 hours following your procedure to check on you and address any questions or concerns that you may have regarding the information given to you following your procedure. If we do not reach you, we will leave a message.  We will attempt to reach you two times.  During this call, we will ask if you have developed any symptoms of COVID 19. If you develop any symptoms (ie: fever, flu-like symptoms, shortness of breath, cough etc.) before then, please call [phone].  If you test positive for Covid 19 in the 2 weeks post procedure, please call and report this information to us.    If any biopsies were taken you will be contacted by phone or by letter within the next 1-3 weeks.  Please call us at [phone] if you have not heard about the biopsies in 3 weeks.    SIGNATURES/CONFIDENTIALITY: You and/or your care partner have signed paperwork which will be entered into your electronic medical record.  These signatures attest to the fact that that the information above on your After Visit Summary has been reviewed and is understood.  Full responsibility of the confidentiality of this discharge information lies with you and/or your care-partner. 

## 2019-07-21 ENCOUNTER — Telehealth: Payer: Self-pay

## 2019-07-21 ENCOUNTER — Encounter: Payer: Self-pay | Admitting: Gastroenterology

## 2019-07-21 NOTE — Telephone Encounter (Signed)
  Follow up Call-  Call back number 07/19/2019  Post procedure Call Back phone  # [phone]  Permission to leave phone message Yes  Some recent data might be hidden     Patient questions:  Do you have a fever, pain , or abdominal swelling? No. Pain Score  0 *  Have you tolerated food without any problems? Yes.    Have you been able to return to your normal activities? Yes.    Do you have any questions about your discharge instructions: Diet   No. Medications  No. Follow up visit  No.  Do you have questions or concerns about your Care? No.  Actions: * If pain score is 4 or above: No action needed, pain <4. 1. Have you developed a fever since your procedure? no  2.   Have you had an respiratory symptoms (SOB or cough) since your procedure? no  3.   Have you tested positive for COVID 19 since your procedure no  4.   Have you had any family members/close contacts diagnosed with the COVID 19 since your procedure?  no   If yes to any of these questions please route to Joylene John, RN and Alphonsa Gin, Therapist, sports.

## 2019-07-25 ENCOUNTER — Telehealth: Payer: Self-pay | Admitting: Adult Health Nurse Practitioner

## 2019-07-25 NOTE — Telephone Encounter (Signed)
Pt is having hemorid issues. She would like to know if it is ok for her to use preparation H? Please advise at [phone].

## 2019-07-25 NOTE — Telephone Encounter (Signed)
Spoke with pt and she stated that she has been dealing with some Hemorid issues since she had the procedure. I advised her to add some sit baths to her daily schedule and it would be oh to add preparation H to help with pain and swelling to see if that would work and if it did not help, to contact us here back at the office so that we c.an further assist her.  Please Advise.

## 2019-08-02 ENCOUNTER — Ambulatory Visit: Payer: Commercial Managed Care - PPO | Admitting: Adult Health Nurse Practitioner

## 2019-08-08 ENCOUNTER — Encounter: Payer: Self-pay | Admitting: Adult Health Nurse Practitioner

## 2019-08-09 ENCOUNTER — Telehealth: Payer: Self-pay | Admitting: Adult Health Nurse Practitioner

## 2019-08-09 NOTE — Telephone Encounter (Signed)
Pt called and would like Provider to give her a call. [phone]

## 2019-08-09 NOTE — Telephone Encounter (Signed)
LVM for pt to cb.

## 2019-08-21 ENCOUNTER — Encounter (INDEPENDENT_AMBULATORY_CARE_PROVIDER_SITE_OTHER): Payer: Self-pay

## 2019-08-30 ENCOUNTER — Ambulatory Visit (INDEPENDENT_AMBULATORY_CARE_PROVIDER_SITE_OTHER): Payer: Commercial Managed Care - PPO | Admitting: Bariatrics

## 2019-08-30 ENCOUNTER — Ambulatory Visit (INDEPENDENT_AMBULATORY_CARE_PROVIDER_SITE_OTHER): Payer: Commercial Managed Care - PPO | Admitting: Adult Health Nurse Practitioner

## 2019-08-30 ENCOUNTER — Other Ambulatory Visit: Payer: Self-pay

## 2019-08-30 VITALS — BP 159/93 | HR 83 | Temp 98.0°F | Ht 60.0 in | Wt 195.8 lb

## 2019-08-30 DIAGNOSIS — D126 Benign neoplasm of colon, unspecified: Secondary | ICD-10-CM | POA: Diagnosis not present

## 2019-08-30 DIAGNOSIS — I10 Essential (primary) hypertension: Secondary | ICD-10-CM | POA: Diagnosis not present

## 2019-08-30 DIAGNOSIS — M62830 Muscle spasm of back: Secondary | ICD-10-CM

## 2019-08-30 MED ORDER — METHYLPREDNISOLONE 4 MG PO TBPK: ORAL_TABLET | ORAL | 0 refills | Status: DC

## 2019-08-30 MED ORDER — METHOCARBAMOL 750 MG PO TABS: 750.0000 mg | ORAL_TABLET | Freq: Four times a day (QID) | ORAL | 0 refills | Status: DC

## 2019-08-30 NOTE — Progress Notes (Signed)
Chief Complaint  Patient presents with  . concerns    Pt has some concerns with colonoscopy results    HPI   Patient presents with 2 concerns:   1.  Back and paraspinal muscle spasm pain.  Low Back Pain: Patient complains of chronic low back pain. This is evaluated as a personal injury. The patient first noted symptoms 3days ago. It was not related to no known injury. The pain is rated moderate, and is located at the across the lower back. The pain is described as aching and occurs all day. The symptoms does been progressive. Symptoms are exacerbated by extension, flexion, lifting, sitting, standing, standing for more than 10 minutes, straining and twisting. Factors which relieve the pain include change in body position, muscle relaxants, NSAIDs and rest. Other associated symptoms include no other symptoms. Previous history of symptoms: the problem is intermittent for 3 years.  Treatment efforts have included rest, OTC NSAIDS, oral steroids and muscle relaxers, with and without relief.   Patient is concerned about colonoscopy.  We reviewed the report together.  Explained that this is what colonoscopies are for--to pick up possible cancers and that is just what the results show:  Patient does not have cancer and was reassured.  Results of Colonoscopy:      Problem List    Problem List: 2021-03: Adenomatous polyp of colon 2021-03: Paraspinal muscle spasm 2020-11: Vitamin D deficiency 2020-10: Smoking 1/2 pack a day or less Essential hypertension Scoliosis of lumbar region due to degenerative disease of spine in  adult Hyperlipidemia Family hx-breast malignancy   Allergies   has No Known Allergies.  Medications    Current Outpatient Medications:  .  atorvastatin (LIPITOR) 20 MG tablet, Take 1 tablet (20 mg total) by mouth daily., Disp: 90 tablet, Rfl: 3 .  lisinopril-hydrochlorothiazide (ZESTORETIC) 20-25 MG tablet, Take 1 tablet by mouth daily., Disp: 30 tablet, Rfl: 3 .   meloxicam (MOBIC) 15 MG tablet, Take 1 tablet (15 mg total) by mouth daily., Disp: 30 tablet, Rfl: 0 .  Vitamin D, Ergocalciferol, (DRISDOL) 1.25 MG (50000 UT) CAPS capsule, Take 1 capsule (50,000 Units total) by mouth every 7 (seven) days., Disp: 5 capsule, Rfl: 2 .  methocarbamol (ROBAXIN-750) 750 MG tablet, Take 1 tablet (750 mg total) by mouth 4 (four) times daily., Disp: 30 tablet, Rfl: 2 .  methylPREDNISolone (MEDROL DOSEPAK) 4 MG TBPK tablet, As directed on pkg label, Disp: 21 each, Rfl: 0   Review of Systems    Constitutional: Negative for activity change, appetite change, chills and fever.  HENT: Negative for congestion, nosebleeds, trouble swallowing and voice change.   Respiratory: Negative for cough, shortness of breath and wheezing.   Cardiac:  Negative for chest pain, pressure, syncope  Gastrointestinal: Negative for diarrhea, nausea and vomiting.  Genitourinary: Negative for difficulty urinating, dysuria, flank pain and hematuria.  Musculoskeletal: Negative for back pain, joint swelling and neck pain.  Neurological: Negative for dizziness, speech difficulty, light-headedness and numbness.  See HPI. All other review of systems negative.     Physical Exam:    height is 5' (1.524 m) and weight is 195 lb 12.8 oz (88.8 kg). Her temporal temperature is 98 F (36.7 C). Her blood pressure is 159/93 (abnormal) and her pulse is 83. Her oxygen saturation is 97%.   Physical Examination: General appearance - alert, well appearing, and in no distress and oriented to person, place, and time Mental status - normal mood, behavior, speech, dress, motor activity, and thought processes  Eyes - PERRL. Extraocular movements intact.  No nystagmus.  Neck - supple, no significant adenopathy, carotids upstroke normal bilaterally, no bruits, thyroid exam: thyroid is normal in size without nodules or tenderness Chest - clear to auscultation, no wheezes, rales or rhonchi, symmetric air entry  Heart -  normal rate, regular rhythm, normal S1, S2, no murmurs, rubs, clicks or gallops Extremities - dependent LE edema without clubbing or cyanosis Skin - normal coloration and turgor, no rashes, no suspicious skin lesions noted  No hyperpigmentation of skin.  No current hematomas noted   Lab /Imaging Review   See above Colonoscopy :  Scheduled f/u for colonoscopy:  3 years or if any new rectal bleeding.  Assessment & Plan:  Janice Jacobs is a 58 y.o. female    1. Paraspinal muscle spasm   2. Essential hypertension   3. Adenomatous polyp of colon, unspecified part of colon    No orders of the defined types were placed in this encounter.  Meds ordered this encounter  Medications  . : methylPREDNISolone (MEDROL DOSEPAK) 4 MG TBPK tablet    Sig: As directed on pkg label    Dispense:  21 each    Refill:  0  . methocarbamol (ROBAXIN-750) 750 MG tablet    Sig: Take 1 tablet (750 mg total) by mouth 4 (four) times daily.    Dispense:  30 tablet    Refill:  0   No orders of the defined types were placed in this encounter.    Glyn Ade, NP

## 2019-08-30 NOTE — Patient Instructions (Signed)
° ° ° °  If you have lab work done today you will be contacted with your lab results within the next 2 weeks.  If you have not heard from us then please contact us. The fastest way to get your results is to register for My Chart. ° ° °IF you received an x-ray today, you will receive an invoice from [facility]. Please contact [facility] at [phone] with questions or concerns regarding your invoice.  ° °IF you received labwork today, you will receive an invoice from [facility]. Please contact [facility] at [phone] with questions or concerns regarding your invoice.  ° °Our billing staff will not be able to assist you with questions regarding bills from these companies. ° °You will be contacted with the lab results as soon as they are available. The fastest way to get your results is to activate your My Chart account. Instructions are located on the last page of this paperwork. If you have not heard from us regarding the results in 2 weeks, please contact this office. °  ° ° ° °

## 2019-09-13 ENCOUNTER — Ambulatory Visit (INDEPENDENT_AMBULATORY_CARE_PROVIDER_SITE_OTHER): Payer: Commercial Managed Care - PPO | Admitting: Bariatrics

## 2019-09-22 ENCOUNTER — Telehealth: Payer: Self-pay | Admitting: Adult Health Nurse Practitioner

## 2019-09-22 NOTE — Telephone Encounter (Signed)
What is the name of the medication?  Janice Jacobs, Janice Jacobs, (DRISDOL) 1.25 MG (50000 UT) CAPS Jacobs  Medication for muscle relaxer  Have you contacted your pharmacy to request a refill? No   Which pharmacy would you like this sent to?  [facility] [phone] - [place], [place] - [address] AT [place]   Patient notified that their request is being sent to the clinical staff for review and that they should receive a call once it is complete. If they do not receive a call within 72 hours they can check with their pharmacy or our office.

## 2019-09-22 NOTE — Telephone Encounter (Signed)
Pt requesting refill high dose Vit D is this acceptable?

## 2019-09-27 ENCOUNTER — Ambulatory Visit: Payer: Commercial Managed Care - PPO | Admitting: Adult Health Nurse Practitioner

## 2019-09-27 ENCOUNTER — Other Ambulatory Visit: Payer: Self-pay

## 2019-09-27 ENCOUNTER — Encounter: Payer: Self-pay | Admitting: Adult Health Nurse Practitioner

## 2019-09-27 VITALS — BP 131/99 | HR 87 | Temp 98.3°F | Ht 60.0 in | Wt 192.4 lb

## 2019-09-27 DIAGNOSIS — E559 Vitamin D deficiency, unspecified: Secondary | ICD-10-CM

## 2019-09-27 DIAGNOSIS — I10 Essential (primary) hypertension: Secondary | ICD-10-CM

## 2019-09-27 DIAGNOSIS — E785 Hyperlipidemia, unspecified: Secondary | ICD-10-CM

## 2019-09-27 LAB — POCT URINALYSIS DIP (MANUAL ENTRY)
Bilirubin, UA: NEGATIVE
Glucose, UA: NEGATIVE mg/dL
Ketones, POC UA: NEGATIVE mg/dL
Leukocytes, UA: NEGATIVE
Nitrite, UA: NEGATIVE
Protein Ur, POC: NEGATIVE mg/dL
Spec Grav, UA: >=1.03 — AB (ref 1.010–1.025)
Urobilinogen, UA: 0.2 E.U./dL
pH, UA: 6 (ref 5.0–8.0)

## 2019-09-27 LAB — GLUCOSE, POCT (MANUAL RESULT ENTRY): POC Glucose: 145 mg/dl — AB (ref 70–99)

## 2019-09-27 MED ORDER — METHYLPREDNISOLONE 4 MG PO TBPK: ORAL_TABLET | ORAL | 0 refills | Status: DC

## 2019-09-27 MED ORDER — METHOCARBAMOL 750 MG PO TABS: 750.0000 mg | ORAL_TABLET | Freq: Four times a day (QID) | ORAL | 2 refills | Status: DC

## 2019-09-27 NOTE — Patient Instructions (Signed)
° ° ° °  If you have lab work done today you will be contacted with your lab results within the next 2 weeks.  If you have not heard from us then please contact us. The fastest way to get your results is to register for My Chart. ° ° °IF you received an x-ray today, you will receive an invoice from [facility]. Please contact [facility] at [phone] with questions or concerns regarding your invoice.  ° °IF you received labwork today, you will receive an invoice from [facility]. Please contact [facility] at [phone] with questions or concerns regarding your invoice.  ° °Our billing staff will not be able to assist you with questions regarding bills from these companies. ° °You will be contacted with the lab results as soon as they are available. The fastest way to get your results is to activate your My Chart account. Instructions are located on the last page of this paperwork. If you have not heard from us regarding the results in 2 weeks, please contact this office. °  ° ° ° °

## 2019-09-27 NOTE — Progress Notes (Signed)
09/27/2019  Janice Jacobs [birth year] [id number]   History   Chief Complaint  Patient presents with  . Follow-up    x1  month on medical conditions    HPI   Janice Jacobs  Follows up on chronic conditions.  She is doing well.  Muscle relaxers worked.  Would like a refill of Prednisone and muscle relaxers just in case she has another episode.  This is not unreasonable.    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  No back spasms in the interim.  She is down to 5 cigarettes a day.  She is not ready to quit for good but we can revisit this summer.      Lab Results  Component Value Date   CREATININE 1.10 (H) 06/21/2019   CREATININE 1.03 (H) 04/12/2019   CREATININE 0.75 02/15/2018   Lab Results  Component Value Date   POCGLU 145 (A) 09/27/2019   Wt Readings from Last 3 Encounters:  09/27/19 192 lb 6.4 oz (87.3 kg)  08/30/19 195 lb 12.8 oz (88.8 kg)  07/19/19 197 lb (89.4 kg)   Temp Readings from Last 3 Encounters:  09/27/19 98.3 F (36.8 C) (Temporal)  08/30/19 98 F (36.7 C) (Temporal)  07/19/19 (!) 96.8 F (36 C)   BP Readings from Last 3 Encounters:  09/27/19 (!) 131/99  08/30/19 (!) 159/93  07/19/19 (!) 167/99   Pulse Readings from Last 3 Encounters:  09/27/19 87  08/30/19 83  07/19/19 74     Past Medical History:  Diagnosis Date  . Arthritis   . Blood transfusion without reported diagnosis   . Family hx-breast malignancy   . Hyperlipidemia   . Hypertension   . Scoliosis of lumbar region due to degenerative disease of spine in adult   . Smoking 1/2 pack a day or less 04/12/2019  . Vitamin D deficiency 05/10/2019   Past Surgical History:  Procedure Laterality Date  . ABDOMINAL HYSTERECTOMY     Family History  Problem Relation Age of Onset  . Colon cancer Neg Hx   . Colon polyps Neg Hx   . Esophageal cancer Neg Hx   . Rectal cancer Neg Hx   . Stomach cancer Neg Hx    Social History   Tobacco Use  . Smoking  status: Current Every Day Smoker    Packs/day: 0.50    Types: Cigarettes  . Smokeless tobacco: Never Used  Substance Use Topics  . Alcohol use: No  . Drug use: No   OB History   No obstetric history on file.    Review of Systems   Review of Systems  Constitutional: Negative for activity change, appetite change, chills and fever.  HENT: Negative for congestion, nosebleeds, trouble swallowing and voice change.   Respiratory: Negative for cough, shortness of breath and wheezing.   Gastrointestinal: Negative for diarrhea, nausea and vomiting.  Genitourinary: Negative for difficulty urinating, dysuria, flank pain and hematuria.  Musculoskeletal: Negative for back pain, joint swelling and neck pain.  Neurological: Negative for dizziness, speech difficulty, light-headedness and numbness.  See HPI. All other review of systems negative.    Allergies   Patient has no known allergies.  Home Medications    Current Outpatient Medications:  .  atorvastatin (LIPITOR) 20 MG tablet, Take 1 tablet (20 mg total) by mouth daily., Disp: 90 tablet, Rfl: 3 .  lisinopril-hydrochlorothiazide (ZESTORETIC) 20-25 MG tablet, Take 1 tablet by mouth daily., Disp: 30 tablet, Rfl: 3 .  meloxicam (MOBIC) 15 MG tablet, Take 1 tablet (15 mg total) by mouth daily., Disp: 30 tablet, Rfl: 0 .  methocarbamol (ROBAXIN-750) 750 MG tablet, Take 1 tablet (750 mg total) by mouth 4 (four) times daily., Disp: 30 tablet, Rfl: 0 .  methylPREDNISolone (MEDROL DOSEPAK) 4 MG TBPK tablet, As directed on pkg label, Disp: 21 each, Rfl: 0 .  Vitamin D, Ergocalciferol, (DRISDOL) 1.25 MG (50000 UT) CAPS capsule, Take 1 capsule (50,000 Units total) by mouth every 7 (seven) days., Disp: 5 capsule, Rfl: 2  Meds Ordered and Administered this Visit  No orders of the defined types were placed in this encounter.   BP (!) 131/99 (BP Location: Left Arm, Patient Position: Sitting, Cuff Size: Normal)   Pulse 87   Temp 98.3 F (36.8 C)  (Temporal)   Ht 5' (1.524 m)   Wt 192 lb 6.4 oz (87.3 kg)   SpO2 98%   BMI 37.58 kg/m   Physical Exam  General appearance: alert, well appearing, and in no distress, oriented to person, place, and time and anxious. Chest: clear to auscultation, no wheezes, rales or rhonchi, symmetric air entry.  CVS exam: normal rate, regular rhythm, normal S1, S2, no murmurs, rubs, clicks or gallops. Skin exam - normal coloration and turgor, no rashes, no suspicious skin lesions noted. Mental Status: normal mood, behavior, speech, dress, motor activity, and thought processes, anxious.   MDM   1. Essential hypertension   2. Vitamin D deficiency   3. Hyperlipidemia, unspecified hyperlipidemia type    .

## 2019-09-28 LAB — CMP14+EGFR
ALT: 12 IU/L (ref 0–32)
AST: 12 IU/L (ref 0–40)
Albumin/Globulin Ratio: 1.5 (ref 1.2–2.2)
Albumin: 4.3 g/dL (ref 3.8–4.9)
Alkaline Phosphatase: 89 IU/L (ref 39–117)
BUN/Creatinine Ratio: 17 (ref 9–23)
BUN: 18 mg/dL (ref 6–24)
Bilirubin Total: <0.2 mg/dL (ref 0.0–1.2)
CO2: 27 mmol/L (ref 20–29)
Calcium: 9.3 mg/dL (ref 8.7–10.2)
Chloride: 100 mmol/L (ref 96–106)
Creatinine, Ser: 1.06 mg/dL — ABNORMAL HIGH (ref 0.57–1.00)
GFR calc Af Amer: 68 mL/min/{1.73_m2} (ref >59)
GFR calc non Af Amer: 59 mL/min/{1.73_m2} — ABNORMAL LOW (ref >59)
Globulin, Total: 2.8 g/dL (ref 1.5–4.5)
Glucose: 143 mg/dL — ABNORMAL HIGH (ref 65–99)
Potassium: 3.8 mmol/L (ref 3.5–5.2)
Sodium: 140 mmol/L (ref 134–144)
Total Protein: 7.1 g/dL (ref 6.0–8.5)

## 2019-09-28 LAB — HEMOGLOBIN A1C
Est. average glucose Bld gHb Est-mCnc: 143 mg/dL
Hgb A1c MFr Bld: 6.6 % — ABNORMAL HIGH (ref 4.8–5.6)

## 2019-09-28 LAB — LIPID PANEL
Chol/HDL Ratio: 5.6 ratio — ABNORMAL HIGH (ref 0.0–4.4)
Cholesterol, Total: 218 mg/dL — ABNORMAL HIGH (ref 100–199)
HDL: 39 mg/dL — ABNORMAL LOW (ref >39)
LDL Chol Calc (NIH): 154 mg/dL — ABNORMAL HIGH (ref 0–99)
Triglycerides: 137 mg/dL (ref 0–149)
VLDL Cholesterol Cal: 25 mg/dL (ref 5–40)

## 2019-09-28 LAB — MICROALBUMIN, URINE: Microalbumin, Urine: 8.8 ug/mL

## 2019-09-28 LAB — VITAMIN D 25 HYDROXY (VIT D DEFICIENCY, FRACTURES): Vit D, 25-Hydroxy: 37.8 ng/mL (ref 30.0–100.0)

## 2019-10-19 ENCOUNTER — Other Ambulatory Visit: Payer: Self-pay | Admitting: Emergency Medicine

## 2019-10-19 ENCOUNTER — Telehealth: Payer: Self-pay | Admitting: Adult Health Nurse Practitioner

## 2019-10-19 DIAGNOSIS — M62830 Muscle spasm of back: Secondary | ICD-10-CM

## 2019-10-19 DIAGNOSIS — E559 Vitamin D deficiency, unspecified: Secondary | ICD-10-CM

## 2019-10-19 MED ORDER — VITAMIN D (ERGOCALCIFEROL) 1.25 MG (50000 UNIT) PO CAPS: 50000.0000 [IU] | ORAL_CAPSULE | ORAL | 2 refills | Status: DC

## 2019-10-19 MED ORDER — MELOXICAM 15 MG PO TABS: 15.0000 mg | ORAL_TABLET | Freq: Every day | ORAL | 0 refills | Status: DC

## 2019-10-19 NOTE — Telephone Encounter (Signed)
Copied from [place] [id number]. Topic: Quick Communication - Rx Refill/Question >> Oct 19, 2019  1:39 PM Izola Price, [place] A wrote: Medication: meloxicam (MOBIC) 15 MG tablet,Vitamin D, Ergocalciferol, (DRISDOL) 1.25 MG (50000 UT) CAPS capsule   Has the patient contacted their pharmacy?yes (Agent: If no, request that the patient contact the pharmacy for the refill.) (Agent: If yes, when and what did the pharmacy advise?)Contact PCP  Preferred Pharmacy (with phone number or street name): [facility], [place] AT [place]  Phone:  [phone] Fax:  [phone]     Agent: Please be advised that RX refills may take up to 3 business days. We ask that you follow-up with your pharmacy.

## 2019-10-25 DIAGNOSIS — Z124 Encounter for screening for malignant neoplasm of cervix: Secondary | ICD-10-CM | POA: Insufficient documentation

## 2019-10-25 DIAGNOSIS — E66811 Obesity, class 1: Secondary | ICD-10-CM | POA: Insufficient documentation

## 2019-10-25 DIAGNOSIS — Z1211 Encounter for screening for malignant neoplasm of colon: Secondary | ICD-10-CM | POA: Insufficient documentation

## 2019-10-25 DIAGNOSIS — E669 Obesity, unspecified: Secondary | ICD-10-CM | POA: Insufficient documentation

## 2019-12-06 ENCOUNTER — Telehealth: Payer: Self-pay | Admitting: Adult Health Nurse Practitioner

## 2019-12-06 NOTE — Telephone Encounter (Signed)
I have called the pt back and informed her that since she was still having the cold to go ahead and make the appointment. I have scheduled her for a mychart video appointment on 12/13/2019 @ 920am.   Pt has tried Vicks cold and flu, alka seltzer, and Sambucol cold and flu and it has help a bit with the runny nose and congestion, however she still has a cough lasting all day. There is flem/mucous in the morning.   Pt stated that she will try the Robitussin and if she is not feeling better she will keep the appointment

## 2019-12-06 NOTE — Telephone Encounter (Signed)
Pt called states she being have a cold for 3 weeks nothing work.need to talk to a Nurse.

## 2019-12-13 ENCOUNTER — Encounter: Payer: Self-pay | Admitting: Emergency Medicine

## 2019-12-13 ENCOUNTER — Other Ambulatory Visit: Payer: Self-pay

## 2019-12-13 ENCOUNTER — Telehealth (INDEPENDENT_AMBULATORY_CARE_PROVIDER_SITE_OTHER): Payer: Commercial Managed Care - PPO | Admitting: Emergency Medicine

## 2019-12-13 VITALS — Ht 60.0 in | Wt 185.0 lb

## 2019-12-13 DIAGNOSIS — J22 Unspecified acute lower respiratory infection: Secondary | ICD-10-CM | POA: Diagnosis not present

## 2019-12-13 DIAGNOSIS — R05 Cough: Secondary | ICD-10-CM

## 2019-12-13 DIAGNOSIS — R059 Cough, unspecified: Secondary | ICD-10-CM

## 2019-12-13 MED ORDER — HYDROCODONE-HOMATROPINE 5-1.5 MG/5ML PO SYRP: 5.0000 mL | ORAL_SOLUTION | Freq: Every evening | ORAL | 0 refills | Status: DC | PRN

## 2019-12-13 MED ORDER — METHOCARBAMOL 750 MG PO TABS: 750.0000 mg | ORAL_TABLET | Freq: Four times a day (QID) | ORAL | 3 refills | Status: DC

## 2019-12-13 MED ORDER — AZITHROMYCIN 250 MG PO TABS: ORAL_TABLET | ORAL | 0 refills | Status: DC

## 2019-12-13 MED ORDER — BENZONATATE 200 MG PO CAPS: 200.0000 mg | ORAL_CAPSULE | Freq: Two times a day (BID) | ORAL | 0 refills | Status: DC | PRN

## 2019-12-13 NOTE — Progress Notes (Signed)
Telemedicine Encounter- SOAP NOTE Established Patient MyChart video conference attempted but unsuccessful. This telephone encounter was conducted with the patient's (or proxy's) verbal consent via audio telecommunications: yes/no: Yes Patient was instructed to have this encounter in a suitably private space; and to only have persons present to whom they give permission to participate. In addition, patient identity was confirmed by use of name plus two identifiers (DOB and address).  I discussed the limitations, risks, security and privacy concerns of performing an evaluation and management service by telephone and the availability of in person appointments. I also discussed with the patient that there may be a patient responsible charge related to this service. The patient expressed understanding and agreed to proceed.  I spent a total of TIME; 0 MIN TO 60 MIN: 20 minutes talking with the patient or their proxy.  Chief Complaint  Patient presents with  . Cough    3 weeks     Subjective   Janice Jacobs is a 59 y.o. female established patient. Telephone visit today complaining of flulike symptoms that started 3 weeks ago with persistent productive cough.  Smoker.  Denies difficulty breathing fever or chills.  Fully vaccinated against Covid.  Some diarrhea but no other significant symptoms.  HPI   Patient Active Problem List   Diagnosis Date Noted  . Screening for colon cancer 10/25/2019  . Screening for cervical cancer 10/25/2019  . Class 1 obesity without serious comorbidity in adult 10/25/2019  . Adenomatous polyp of colon 08/30/2019  . Paraspinal muscle spasm 08/30/2019  . Vitamin D deficiency 05/10/2019  . Smoking 1/2 pack a day or less 04/12/2019  . Essential hypertension   . Scoliosis of lumbar region due to degenerative disease of spine in adult   . Hyperlipidemia   . Family hx-breast malignancy     Past Medical History:  Diagnosis Date  . Arthritis   . Blood  transfusion without reported diagnosis   . Family hx-breast malignancy   . Hyperlipidemia   . Hypertension   . Scoliosis of lumbar region due to degenerative disease of spine in adult   . Smoking 1/2 pack a day or less 04/12/2019  . Vitamin D deficiency 05/10/2019    Current Outpatient Medications  Medication Sig Dispense Refill  . atorvastatin (LIPITOR) 20 MG tablet Take 1 tablet (20 mg total) by mouth daily. 90 tablet 3  . lisinopril-hydrochlorothiazide (ZESTORETIC) 20-25 MG tablet Take 1 tablet by mouth daily. 30 tablet 3  . meloxicam (MOBIC) 15 MG tablet Take 1 tablet (15 mg total) by mouth daily. 30 tablet 0  . methocarbamol (ROBAXIN-750) 750 MG tablet Take 1 tablet (750 mg total) by mouth 4 (four) times daily. 30 tablet 2  . Vitamin D, Ergocalciferol, (DRISDOL) 1.25 MG (50000 UNIT) CAPS capsule Take 1 capsule (50,000 Units total) by mouth every 7 (seven) days. 5 capsule 2   No current facility-administered medications for this visit.    No Known Allergies  Social History   Socioeconomic History  . Marital status: Divorced    Spouse name: Not on file  . Number of children: Not on file  . Years of education: Not on file  . Highest education level: Not on file  Occupational History  . Not on file  Tobacco Use  . Smoking status: Current Every Day Smoker    Packs/day: 0.50    Types: Cigarettes  . Smokeless tobacco: Never Used  Vaping Use  . Vaping Use: Never used  Substance and Sexual  Activity  . Alcohol use: No  . Drug use: No  . Sexual activity: Never  Other Topics Concern  . Not on file  Social History Narrative   Social Documentation   Social Determinants of Health   Financial Resource Strain:   . Difficulty of Paying Living Expenses:   Food Insecurity:   . Worried About Charity fundraiser in the Last Year:   . Arboriculturist in the Last Year:   Transportation Needs:   . Film/video editor (Medical):   Marland Kitchen Lack of Transportation (Non-Medical):     Physical Activity:   . Days of Exercise per Week:   . Minutes of Exercise per Session:   Stress:   . Feeling of Stress :   Social Connections:   . Frequency of Communication with Friends and Family:   . Frequency of Social Gatherings with Friends and Family:   . Attends Religious Services:   . Active Member of Clubs or Organizations:   . Attends Archivist Meetings:   Marland Kitchen Marital Status:   Intimate Partner Violence:   . Fear of Current or Ex-Partner:   . Emotionally Abused:   Marland Kitchen Physically Abused:   . Sexually Abused:     Review of Systems  Constitutional: Negative.  Negative for chills and fever.  HENT: Negative.  Negative for congestion and sore throat.   Respiratory: Positive for cough and sputum production. Negative for hemoptysis, shortness of breath and wheezing.   Cardiovascular: Negative.  Negative for chest pain and palpitations.  Gastrointestinal: Negative.  Negative for abdominal pain, diarrhea, nausea and vomiting.  Genitourinary: Negative.  Negative for dysuria and hematuria.  Musculoskeletal: Negative.  Negative for back pain, myalgias and neck pain.  Skin: Negative.  Negative for rash.  Neurological: Negative.  Negative for dizziness and headaches.  All other systems reviewed and are negative.   Objective  Alert and oriented x3 in no apparent respiratory distress. Vitals as reported by the patient: Today's Vitals   12/13/19 0818  Weight: 185 lb (83.9 kg)  Height: 5' (1.524 m)    There are no diagnoses linked to this encounter. Janice Jacobs was seen today for cough.  Diagnoses and all orders for this visit:  Cough -     benzonatate (TESSALON) 200 MG capsule; Take 1 capsule (200 mg total) by mouth 2 (two) times daily as needed for cough. -     HYDROcodone-homatropine (HYCODAN) 5-1.5 MG/5ML syrup; Take 5 mLs by mouth at bedtime as needed for cough.  Lower respiratory infection -     azithromycin (ZITHROMAX) 250 MG tablet; Sig as indicated  Other  orders -     methocarbamol (ROBAXIN-750) 750 MG tablet; Take 1 tablet (750 mg total) by mouth 4 (four) times daily.     I discussed the assessment and treatment plan with the patient. The patient was provided an opportunity to ask questions and all were answered. The patient agreed with the plan and demonstrated an understanding of the instructions.   The patient was advised to call back or seek an in-person evaluation if the symptoms worsen or if the condition fails to improve as anticipated.  I provided 20 minutes of non-face-to-face time during this encounter.  Horald Pollen, MD  [facility] at [facility]

## 2019-12-13 NOTE — Patient Instructions (Signed)
° ° ° °  If you have lab work done today you will be contacted with your lab results within the next 2 weeks.  If you have not heard from us then please contact us. The fastest way to get your results is to register for My Chart. ° ° °IF you received an x-ray today, you will receive an invoice from [facility]. Please contact [facility] at [phone] with questions or concerns regarding your invoice.  ° °IF you received labwork today, you will receive an invoice from [facility]. Please contact [facility] at [phone] with questions or concerns regarding your invoice.  ° °Our billing staff will not be able to assist you with questions regarding bills from these companies. ° °You will be contacted with the lab results as soon as they are available. The fastest way to get your results is to activate your My Chart account. Instructions are located on the last page of this paperwork. If you have not heard from us regarding the results in 2 weeks, please contact this office. °  ° ° ° °

## 2020-01-19 ENCOUNTER — Telehealth: Payer: Self-pay | Admitting: Emergency Medicine

## 2020-01-19 ENCOUNTER — Other Ambulatory Visit: Payer: Self-pay

## 2020-01-19 DIAGNOSIS — E559 Vitamin D deficiency, unspecified: Secondary | ICD-10-CM

## 2020-01-19 DIAGNOSIS — M62830 Muscle spasm of back: Secondary | ICD-10-CM

## 2020-01-19 MED ORDER — MELOXICAM 15 MG PO TABS: 15.0000 mg | ORAL_TABLET | Freq: Every day | ORAL | 0 refills | Status: DC

## 2020-01-19 MED ORDER — VITAMIN D (ERGOCALCIFEROL) 1.25 MG (50000 UNIT) PO CAPS: 50000.0000 [IU] | ORAL_CAPSULE | ORAL | 2 refills | Status: DC
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
## 2020-03-13 ENCOUNTER — Telehealth: Payer: Self-pay | Admitting: Emergency Medicine

## 2020-03-13 NOTE — Telephone Encounter (Signed)
03/13/2020 - PATIENT CALLED TO SAY SHE IS TOTALLY OUT OF THE METHOCARBAMOL 750 mg. SHE NEEDS IT REFILLED TODAY. I HAVE SCHEDULED A TRANSFER OF CARE WITH KELSEA JUST ON Wednesday 04/24/2020 AT 8:00 am. [facility] BYRD PATIENT. PLEASE CALL HER WHEN IT HAS BEEN DONE. BEST PHONE [phone] (CELL) [place]

## 2020-03-13 NOTE — Telephone Encounter (Signed)
Requested medication (s) are due for refill today: yes  Requested medication (s) are on the active medication list: yes  Last refill:  12/13/19  Future visit scheduled: no  Notes to clinic:  not delegated    Requested Prescriptions  Pending Prescriptions Disp Refills   methocarbamol (ROBAXIN) 750 MG tablet [Pharmacy Med Name: METHOCARBAMOL 750MG  TABLETS] 30 tablet 3    Sig: TAKE 1 TABLET(750 MG) BY MOUTH FOUR TIMES DAILY      Not Delegated - Analgesics:  Muscle Relaxants Failed - 03/13/2020  9:12 AM      Failed - This refill cannot be delegated      Passed - Valid encounter within last 6 months    Recent Outpatient Visits           3 months ago Cough   Primary Care at [facility], Ines Bloomer, MD   5 months ago Essential hypertension   Primary Care at [facility], Lorelee Market, NP   6 months ago Paraspinal muscle spasm   Primary Care at [facility], Lorelee Market, NP   7 months ago Essential hypertension   Primary Care at [facility], Lorelee Market, NP   8 months ago Screening for colon cancer   Primary Care at [facility], Lorelee Market, NP

## 2020-03-13 NOTE — Telephone Encounter (Signed)
Refill request was forwarded to the provider.

## 2020-03-26 ENCOUNTER — Other Ambulatory Visit: Payer: Self-pay | Admitting: Emergency Medicine

## 2020-03-26 DIAGNOSIS — M62830 Muscle spasm of back: Secondary | ICD-10-CM

## 2020-03-27 MED ORDER — MELOXICAM 15 MG PO TABS: 15.0000 mg | ORAL_TABLET | Freq: Every day | ORAL | 0 refills | Status: DC

## 2020-04-24 ENCOUNTER — Encounter: Payer: Self-pay | Admitting: Family Medicine

## 2020-04-24 ENCOUNTER — Ambulatory Visit: Payer: Commercial Managed Care - PPO | Admitting: Family Medicine

## 2020-04-24 ENCOUNTER — Other Ambulatory Visit: Payer: Self-pay

## 2020-04-24 ENCOUNTER — Telehealth: Payer: Self-pay | Admitting: Family Medicine

## 2020-04-24 VITALS — BP 133/89 | HR 83 | Temp 98.7°F | Ht 60.0 in | Wt 195.0 lb

## 2020-04-24 DIAGNOSIS — M62830 Muscle spasm of back: Secondary | ICD-10-CM

## 2020-04-24 DIAGNOSIS — I10 Essential (primary) hypertension: Secondary | ICD-10-CM

## 2020-04-24 DIAGNOSIS — F17200 Nicotine dependence, unspecified, uncomplicated: Secondary | ICD-10-CM

## 2020-04-24 DIAGNOSIS — Z1159 Encounter for screening for other viral diseases: Secondary | ICD-10-CM

## 2020-04-24 DIAGNOSIS — M4186 Other forms of scoliosis, lumbar region: Secondary | ICD-10-CM

## 2020-04-24 DIAGNOSIS — E782 Mixed hyperlipidemia: Secondary | ICD-10-CM

## 2020-04-24 DIAGNOSIS — M4156 Other secondary scoliosis, lumbar region: Secondary | ICD-10-CM

## 2020-04-24 DIAGNOSIS — R5383 Other fatigue: Secondary | ICD-10-CM

## 2020-04-24 DIAGNOSIS — Z114 Encounter for screening for human immunodeficiency virus [HIV]: Secondary | ICD-10-CM

## 2020-04-24 DIAGNOSIS — E559 Vitamin D deficiency, unspecified: Secondary | ICD-10-CM

## 2020-04-24 MED ORDER — LISINOPRIL-HYDROCHLOROTHIAZIDE 20-25 MG PO TABS: 1.0000 | ORAL_TABLET | Freq: Every day | ORAL | 3 refills | Status: DC

## 2020-04-24 MED ORDER — MELOXICAM 15 MG PO TABS: 15.0000 mg | ORAL_TABLET | Freq: Every day | ORAL | 3 refills | Status: DC

## 2020-04-24 MED ORDER — METHOCARBAMOL 750 MG PO TABS: 750.0000 mg | ORAL_TABLET | Freq: Four times a day (QID) | ORAL | 3 refills | Status: DC

## 2020-04-24 MED ORDER — ATORVASTATIN CALCIUM 20 MG PO TABS: 20.0000 mg | ORAL_TABLET | Freq: Every day | ORAL | 3 refills | Status: DC

## 2020-04-24 NOTE — Progress Notes (Signed)
11/10/20218:43 AM  Janice Jacobs 1961/06/08, 59 y.o., female [id number]  Chief Complaint  Patient presents with  . Transitions Of Care  . Back Pain    R shoulder - chronic pain  . low energy    HPI:   Patient is a 59 y.o. female with past medical history significant for HTN, HLD, Vitamin D deficiency who presents today for transfer of care.  Overall doing well Has a surprise trip planned with her daughters for her bday this weekend Issues with fatigue Uses caffeine to help with this Issues with insomnia and wakes up around 3am every morning   HTN Lisinopril/HCTZ: 20-7m daily Goal< 140/90 BP Readings from Last 3 Encounters:  04/24/20 133/89  09/27/19 (!) 131/99  08/30/19 (!) 159/93    HLD Atorvastatin 210mdaily No new myalgias  Lab Results  Component Value Date   CHOL 218 (H) 09/27/2019   HDL 39 (L) 09/27/2019   LDLCALC 154 (H) 09/27/2019   TRIG 137 09/27/2019   CHOLHDL 5.6 (H) 09/27/2019     Back Pain Meloxicam 1550methocarbamol 750m76m day Lower back pain Xray: 02/15/18: . Minimal anterior slip L4 and L5 secondary to facet degenerative changes. 2. Minimal L3-4 and L4-5 disc space narrowing. Mild L5-S1 disc space narrowing.   Vitamin D Def 50,000 unit weekly Last vitamin D Lab Results  Component Value Date   VD25OH 37.8 09/27/2019    Screenings: Colon Ca: 07/19/19 colonoscopy due 3 years 07/18/22 PaP: Needs to do Mammogram: Needs to schedule Flu: Declines Covid: done Hep C: 04/24/20 HIV: 04/24/20 Dentist: needs to go Eye doctor: needs to go Partial hysterectomy:   Depression screen PHQ [facility] 04/24/2020 09/27/2019 08/30/2019  Decreased Interest 0 0 0  Down, Depressed, Hopeless 0 0 0  PHQ - 2 Score 0 0 0    Fall Risk  04/24/2020 12/13/2019 09/27/2019 08/30/2019 06/21/2019  Falls in the past year? - 0 0 0 0  Number falls in past yr: 0 0 0 - 0  Injury with Fall? 0 0 0 0 0  Follow up Falls evaluation completed Falls evaluation completed Falls  evaluation completed Falls evaluation completed Falls evaluation completed     No Known Allergies  Prior to Admission medications   Medication Sig Start Date End Date Taking? Authorizing Provider  atorvastatin (LIPITOR) 20 MG tablet Take 1 tablet (20 mg total) by mouth daily. 05/10/19   ByrdWendall Mola  azithromycin ([facility]0 MG tablet Sig as indicated 12/13/19   SagaHorald Pollen  benzonatate (TESSALON) 200 MG capsule Take 1 capsule (200 mg total) by mouth 2 (two) times daily as needed for cough. 12/13/19   SagaHorald Pollen  HYDROcodone-homatropine (HYCGree[facility] Center1.5 MG/5ML syrup Take 5 mLs by mouth at bedtime as needed for cough. 12/13/19   SagaHorald Pollen  lisinopril-hydrochlorothiazide (ZESTORETIC) 20-25 MG tablet Take 1 tablet by mouth daily. 06/21/19   ByrdWendall Mola  meloxicam (MOBIC) 15 MG tablet Take 1 tablet (15 mg total) by mouth daily. 03/27/20   SagaHorald Pollen  methocarbamol (ROBAXIN-750) 750 MG tablet Take 1 tablet (750 mg total) by mouth 4 (four) times daily. 12/13/19   SagaHorald Pollen  Vitamin D, Ergocalciferol, (DRISDOL) 1.25 MG (50000 UNIT) CAPS capsule Take 1 capsule (50,000 Units total) by mouth every 7 (seven) days. 01/19/20   SagaHorald Pollen    Past Medical History:  Diagnosis Date  . Arthritis   . Blood transfusion without  reported diagnosis   . Family hx-breast malignancy   . Hyperlipidemia   . Hypertension   . Scoliosis of lumbar region due to degenerative disease of spine in adult   . Smoking 1/2 pack a day or less 04/12/2019  . Vitamin D deficiency 05/10/2019    Past Surgical History:  Procedure Laterality Date  . ABDOMINAL HYSTERECTOMY      Social History   Tobacco Use  . Smoking status: Current Every Day Smoker    Packs/day: 0.50    Types: Cigarettes  . Smokeless tobacco: Never Used  Substance Use Topics  . Alcohol use: No    Family History  Problem Relation Age of Onset    . Colon cancer Neg Hx   . Colon polyps Neg Hx   . Esophageal cancer Neg Hx   . Rectal cancer Neg Hx   . Stomach cancer Neg Hx     Review of Systems  Constitutional: Positive for malaise/fatigue. Negative for chills and fever.  Eyes: Negative for blurred vision and double vision.  Respiratory: Negative for cough, shortness of breath and wheezing.   Cardiovascular: Negative for chest pain, palpitations and leg swelling.  Gastrointestinal: Negative for abdominal pain, blood in stool, constipation, diarrhea, heartburn, nausea and vomiting.  Genitourinary: Negative for dysuria, frequency and hematuria.  Musculoskeletal: Negative for back pain and joint pain.  Skin: Negative for rash.  Neurological: Negative for dizziness, weakness and headaches.  Psychiatric/Behavioral: The patient has insomnia.      OBJECTIVE:  Today's Vitals   04/24/20 0802  BP: 133/89  Pulse: 83  Temp: 98.7 F (37.1 C)  SpO2: 98%  Weight: 195 lb (88.5 kg)  Height: 5' (1.524 m)   Body mass index is 38.08 kg/m.   Physical Exam Constitutional:      General: She is not in acute distress.    Appearance: Normal appearance. She is not ill-appearing.  HENT:     Head: Normocephalic.  Cardiovascular:     Rate and Rhythm: Normal rate and regular rhythm.     Pulses: Normal pulses.     Heart sounds: Normal heart sounds. No murmur heard.  No friction rub. No gallop.   Pulmonary:     Effort: Pulmonary effort is normal. No respiratory distress.     Breath sounds: Normal breath sounds. No stridor. No wheezing, rhonchi or rales.  Abdominal:     General: Bowel sounds are normal.     Palpations: Abdomen is soft.     Tenderness: There is no abdominal tenderness.  Musculoskeletal:     Right lower leg: No edema.     Left lower leg: No edema.  Skin:    General: Skin is warm and dry.  Neurological:     Mental Status: She is alert and oriented to person, place, and time.  Psychiatric:        Mood and Affect: Mood  normal.        Behavior: Behavior normal.     No results found for this or any previous visit (from the past 24 hour(s)).  No results found.   ASSESSMENT and PLAN  Problem List Items Addressed This Visit      Cardiovascular and Mediastinum   Essential hypertension - Primary   Relevant Medications   atorvastatin (LIPITOR) 20 MG tablet   lisinopril-hydrochlorothiazide (ZESTORETIC) 20-25 MG tablet   Other Relevant Orders   CMP14+EGFR     Musculoskeletal and Integument   Scoliosis of lumbar region due to degenerative disease of spine in  adult (Chronic)   Relevant Medications   meloxicam (MOBIC) 15 MG tablet     Other   Hyperlipidemia (Chronic)   Relevant Medications   atorvastatin (LIPITOR) 20 MG tablet   lisinopril-hydrochlorothiazide (ZESTORETIC) 20-25 MG tablet   Vitamin D deficiency (Chronic)   Relevant Orders   Vitamin D, 25-hydroxy   Paraspinal muscle spasm (Chronic)   Relevant Medications   methocarbamol (ROBAXIN-750) 750 MG tablet    Other Visit Diagnoses    Fatigue, unspecified type       Relevant Orders   CBC   Hemoglobin A1c   Encounter for hepatitis C screening test for low risk patient       Relevant Orders   Hepatitis C antibody   Encounter for screening for HIV       Relevant Orders   HIV Antibody (routine testing w rflx)   Current smoker         Stable on current regimen. Will follow up with lab results Needs to schedule pap and mammogram Declines flu shot.  Return in about 6 months (around 10/22/2020).    Huston Foley Jonathandavid Marlett, FNP-BC Primary Care at [place], [place] [address] Ph.  [phone] Fax [phone]

## 2020-04-24 NOTE — Telephone Encounter (Signed)
Pt would like to scheduled mammogram appt on 12/15. I tried to schedule but it was not working for me pt would like a call to schedule that appt. Please advise.

## 2020-04-24 NOTE — Patient Instructions (Addendum)
We recommend that you schedule a mammogram for breast cancer screening. Typically, you do not need a referral to do this. Please contact a local imaging center to schedule your mammogram.  [place] [facility] monthly: call front desk  [facility] - [phone]  *ask for the Radiology Department [facility] - [phone] or [phone]  [facility] - [phone] [place] [phone] [facility] - [phone]  *ask for the [facility] - [phone]  *ask for the Radiology Department [facility] - [phone]  *ask for the [facility] - [phone]    Health Maintenance, Female Adopting a healthy lifestyle and getting preventive care are important in promoting health and wellness. Ask your health care provider about:  The right schedule for you to have regular tests and exams.  Things you can do on your own to prevent diseases and keep yourself healthy. What should I know about diet, weight, and exercise? Eat a healthy diet   Eat a diet that includes plenty of vegetables, fruits, low-fat dairy products, and lean protein.  Do not eat a lot of foods that are high in solid fats, added sugars, or sodium. Maintain a healthy weight Body mass index (BMI) is used to identify weight problems. It estimates body fat based on height and weight. Your health care provider can help determine your BMI and help you achieve or maintain a healthy weight. Get regular exercise Get regular exercise. This is one of the most important things you can do for your health. Most adults should:  Exercise for at least 150 minutes each week. The exercise should increase your heart rate and make you sweat (moderate-intensity exercise).  Do strengthening exercises at least twice a week. This is in addition to the moderate-intensity  exercise.  Spend less time sitting. Even light physical activity can be beneficial. Watch cholesterol and blood lipids Have your blood tested for lipids and cholesterol at 57 years of age, then have this test every 5 years. Have your cholesterol levels checked more often if:  Your lipid or cholesterol levels are high.  You are older than 57 years of age.  You are at high risk for heart disease. What should I know about cancer screening? Depending on your health history and family history, you may need to have cancer screening at various ages. This may include screening for:  Breast cancer.  Cervical cancer.  Colorectal cancer.  Skin cancer.  Lung cancer. What should I know about heart disease, diabetes, and high blood pressure? Blood pressure and heart disease  High blood pressure causes heart disease and increases the risk of stroke. This is more likely to develop in people who have high blood pressure readings, are of African descent, or are overweight.  Have your blood pressure checked: ? Every 3-5 years if you are 84-49 years of age. ? Every year if you are 69 years old or older. Diabetes Have regular diabetes screenings. This checks your fasting blood sugar level. Have the screening done:  Once every three years after age 43 if you are at a normal weight and have a low risk for diabetes.  More often and at a younger age if you are overweight or have a high risk for diabetes. What should I know about preventing infection? Hepatitis B If you have a higher risk for hepatitis B, you should be screened for this virus. Talk  with your health care provider to find out if you are at risk for hepatitis B infection. Hepatitis C Testing is recommended for:  Everyone born from [birth year] through 1963.  Anyone with known risk factors for hepatitis C. Sexually transmitted infections (STIs)  Get screened for STIs, including gonorrhea and chlamydia, if: ? You are sexually active and  are younger than 57 years of age. ? You are older than 57 years of age and your health care provider tells you that you are at risk for this type of infection. ? Your sexual activity has changed since you were last screened, and you are at increased risk for chlamydia or gonorrhea. Ask your health care provider if you are at risk.  Ask your health care provider about whether you are at high risk for HIV. Your health care provider may recommend a prescription medicine to help prevent HIV infection. If you choose to take medicine to prevent HIV, you should first get tested for HIV. You should then be tested every 3 months for as long as you are taking the medicine. Pregnancy  If you are about to stop having your period (premenopausal) and you may become pregnant, seek counseling before you get pregnant.  Take 400 to 800 micrograms (mcg) of folic acid every day if you become pregnant.  Ask for birth control (contraception) if you want to prevent pregnancy. Osteoporosis and menopause Osteoporosis is a disease in which the bones lose minerals and strength with aging. This can result in bone fractures. If you are 87 years old or older, or if you are at risk for osteoporosis and fractures, ask your health care provider if you should:  Be screened for bone loss.  Take a calcium or vitamin D supplement to lower your risk of fractures.  Be given hormone replacement therapy (HRT) to treat symptoms of menopause. Follow these instructions at home: Lifestyle  Do not use any products that contain nicotine or tobacco, such as cigarettes, e-cigarettes, and chewing tobacco. If you need help quitting, ask your health care provider.  Do not use street drugs.  Do not share needles.  Ask your health care provider for help if you need support or information about quitting drugs. Alcohol use  Do not drink alcohol if: ? Your health care provider tells you not to drink. ? You are pregnant, may be pregnant,  or are planning to become pregnant.  If you drink alcohol: ? Limit how much you use to 0-1 drink a day. ? Limit intake if you are breastfeeding.  Be aware of how much alcohol is in your drink. In the [place] one drink equals one 12 oz bottle of beer (355 mL), one 5 oz glass of wine (148 mL), or one 1 oz glass of hard liquor (44 mL). General instructions  Schedule regular health, dental, and eye exams.  Stay current with your vaccines.  Tell your health care provider if: ? You often feel depressed. ? You have ever been abused or do not feel safe at home. Summary  Adopting a healthy lifestyle and getting preventive care are important in promoting health and wellness.  Follow your health care provider's instructions about healthy diet, exercising, and getting tested or screened for diseases.  Follow your health care provider's instructions on monitoring your cholesterol and blood pressure. This information is not intended to replace advice given to you by your health care provider. Make sure you discuss any questions you have with your health care provider. Document Revised: 05/25/2018  Document Reviewed: 05/25/2018 Elsevier Patient Education  [employer].   If you have lab work done today you will be contacted with your lab results within the next 2 weeks.  If you have not heard from [place] then please contact us. The fastest way to get your results is to register for My Chart.   IF you received an x-ray today, you will receive an invoice from [facility]. Please contact [facility] at [phone] with questions or concerns regarding your invoice.   IF you received labwork today, you will receive an invoice from [facility]. Please contact [facility] at [phone] with questions or concerns regarding your invoice.   Our billing staff will not be able to assist you with questions regarding bills from these companies.  You will be contacted with the lab results as  soon as they are available. The fastest way to get your results is to activate your My Chart account. Instructions are located on the last page of this paperwork. If you have not heard from [place] regarding the results in 2 weeks, please contact this office.

## 2020-04-25 ENCOUNTER — Other Ambulatory Visit: Payer: Self-pay | Admitting: Family Medicine

## 2020-04-25 DIAGNOSIS — E119 Type 2 diabetes mellitus without complications: Secondary | ICD-10-CM | POA: Insufficient documentation

## 2020-04-25 LAB — CMP14+EGFR
ALT: 12 IU/L (ref 0–32)
AST: 13 IU/L (ref 0–40)
Albumin/Globulin Ratio: 1.8 (ref 1.2–2.2)
Albumin: 4.4 g/dL (ref 3.8–4.9)
Alkaline Phosphatase: 82 IU/L (ref 44–121)
BUN/Creatinine Ratio: 19 (ref 9–23)
BUN: 19 mg/dL (ref 6–24)
Bilirubin Total: <0.2 mg/dL (ref 0.0–1.2)
CO2: 22 mmol/L (ref 20–29)
Calcium: 9.8 mg/dL (ref 8.7–10.2)
Chloride: 103 mmol/L (ref 96–106)
Creatinine, Ser: 1 mg/dL (ref 0.57–1.00)
GFR calc Af Amer: 73 mL/min/{1.73_m2} (ref >59)
GFR calc non Af Amer: 63 mL/min/{1.73_m2} (ref >59)
Globulin, Total: 2.5 g/dL (ref 1.5–4.5)
Glucose: 111 mg/dL — ABNORMAL HIGH (ref 65–99)
Potassium: 4.4 mmol/L (ref 3.5–5.2)
Sodium: 141 mmol/L (ref 134–144)
Total Protein: 6.9 g/dL (ref 6.0–8.5)

## 2020-04-25 LAB — CBC
Hematocrit: 44 % (ref 34.0–46.6)
Hemoglobin: 14.7 g/dL (ref 11.1–15.9)
MCH: 30.8 pg (ref 26.6–33.0)
MCHC: 33.4 g/dL (ref 31.5–35.7)
MCV: 92 fL (ref 79–97)
Platelets: 274 10*3/uL (ref 150–450)
RBC: 4.78 x10E6/uL (ref 3.77–5.28)
RDW: 13.7 % (ref 11.7–15.4)
WBC: 7.6 10*3/uL (ref 3.4–10.8)

## 2020-04-25 LAB — VITAMIN D 25 HYDROXY (VIT D DEFICIENCY, FRACTURES): Vit D, 25-Hydroxy: 42 ng/mL (ref 30.0–100.0)

## 2020-04-25 LAB — HIV ANTIBODY (ROUTINE TESTING W REFLEX): HIV Screen 4th Generation wRfx: NONREACTIVE

## 2020-04-25 LAB — HEMOGLOBIN A1C
Est. average glucose Bld gHb Est-mCnc: 146 mg/dL
Hgb A1c MFr Bld: 6.7 % — ABNORMAL HIGH (ref 4.8–5.6)

## 2020-04-25 LAB — HEPATITIS C ANTIBODY: Hep C Virus Ab: <0.1 s/co ratio (ref 0.0–0.9)

## 2020-04-25 MED ORDER — METFORMIN HCL 500 MG PO TABS: 500.0000 mg | ORAL_TABLET | Freq: Every day | ORAL | 3 refills | Status: DC

## 2020-04-25 NOTE — Progress Notes (Signed)
Hello, If you could let Janice Jacobs know overall her labs look good. Her A1C does put her in the diabetes range and I would recommend her starting a medication called Metformin daily for this. I will place the order have her let me know if she has any questions and we can set up a time to discuss further if she would like. Thanks!

## 2020-04-26 ENCOUNTER — Telehealth: Payer: Self-pay | Admitting: Family Medicine

## 2020-04-26 NOTE — Telephone Encounter (Signed)
Received a fax from after hours stating pt was diagnosed with diabetes and is requesting medication information. She has some questions she would like answered. Please advise.

## 2020-04-26 NOTE — Telephone Encounter (Signed)
I have called pt and relayed the message from the provider and informed her that being more mindful on what she is eating is key. She is try to stay away from carbs, sweet, and soda; however, this does not mean that you have to take it out of the diet completely. Also informed her to take the medication for that will help as well. Pt stated understanding and I have wished her a HAPPY BIRTHDAY since it is today.   Thanks.

## 2020-05-01 NOTE — Telephone Encounter (Signed)
Appt has been scheduled and patient has been informed

## 2020-05-21 ENCOUNTER — Ambulatory Visit: Payer: Self-pay | Admitting: *Deleted

## 2020-05-21 NOTE — Telephone Encounter (Signed)
Relayed message to pt regarding diet pills per provider's notes. Pt verbalizes understanding.

## 2020-05-21 NOTE — Telephone Encounter (Signed)
There are no studies that look at the safety or efficacy of these medications, thus I am unable to recommend them. That doesn't mean they are not ok for her to take, but I have no strong recommendations when it comes to these or any other diet pills.

## 2020-05-21 NOTE — Telephone Encounter (Signed)
Patient calls to see if it would be aright to take OTC  Nutra Slim keto diet pills. Please advise.

## 2020-05-21 NOTE — Telephone Encounter (Signed)
Summary: vitamin advice   Pt wanted to speak with nurse about taking Keto diet pills/ please advise      Patient wants to try diet pill: Keto Nutra slim- FDA approved  Advised patient of the action of the keto diet- and possible side effects: constipation, mild low blood sugar, indigestion, increased risk kidney stones. Will send message to PCP for review and recommendation.  Reason for Disposition . Caller wants to use a complementary or alternative medicine  Answer Assessment - Initial Assessment Questions 1. NAME of MEDICATION: "What medicine are you calling about?"     Nutra Slim keto pills 2. QUESTION: "What is your question?" (e.g., medication refill, side effect)     Is it safe for patient to take 3. PRESCRIBING HCP: "Who prescribed it?" Reason: if prescribed by specialist, call should be referred to that group.     OTC diet pill 4. SYMPTOMS: "Do you have any symptoms?"     n/a 5. SEVERITY: If symptoms are present, ask "Are they mild, moderate or severe?"     n/a 6. PREGNANCY:  "Is there any chance that you are pregnant?" "When was your last menstrual period?"     n/a  Protocols used: MEDICATION QUESTION CALL-A-AH

## 2020-06-18 ENCOUNTER — Telehealth: Payer: Self-pay | Admitting: Family Medicine

## 2020-06-18 NOTE — Telephone Encounter (Signed)
Patient is feeling tired since recent med change patient has been taking  metFORMIN (GLUCOPHAGE) 500 MG tablet [[id number] Patient states she is too drowsy to wok     Is there another option    Please advise

## 2020-06-20 ENCOUNTER — Ambulatory Visit: Payer: Commercial Managed Care - PPO | Admitting: Family Medicine

## 2020-06-20 NOTE — Telephone Encounter (Signed)
Patient was informed msg below and seems to think now that it was not the medication and she had that 72 hr virus. Patient states she is feeling much better now and her blood sugar has been 99 and 104 in the past few days. If she have amy more problems she will give [place] a call

## 2020-06-20 NOTE — Telephone Encounter (Signed)
Please advise on what you would like for me to relay to this patient

## 2020-06-20 NOTE — Telephone Encounter (Signed)
That medication shouldn't make her drowsy. I would have her schedule an appointment so we could evaluate this further.

## 2020-07-25 ENCOUNTER — Other Ambulatory Visit: Payer: Self-pay | Admitting: Family Medicine

## 2020-07-30 IMAGING — DX DG CHEST 2V
2 series · 2 of 2 positions shown · non-contrast
Comparison: None.

CLINICAL DATA: Tobacco abuse

EXAM:
CHEST - 2 VIEW

[chest pa]
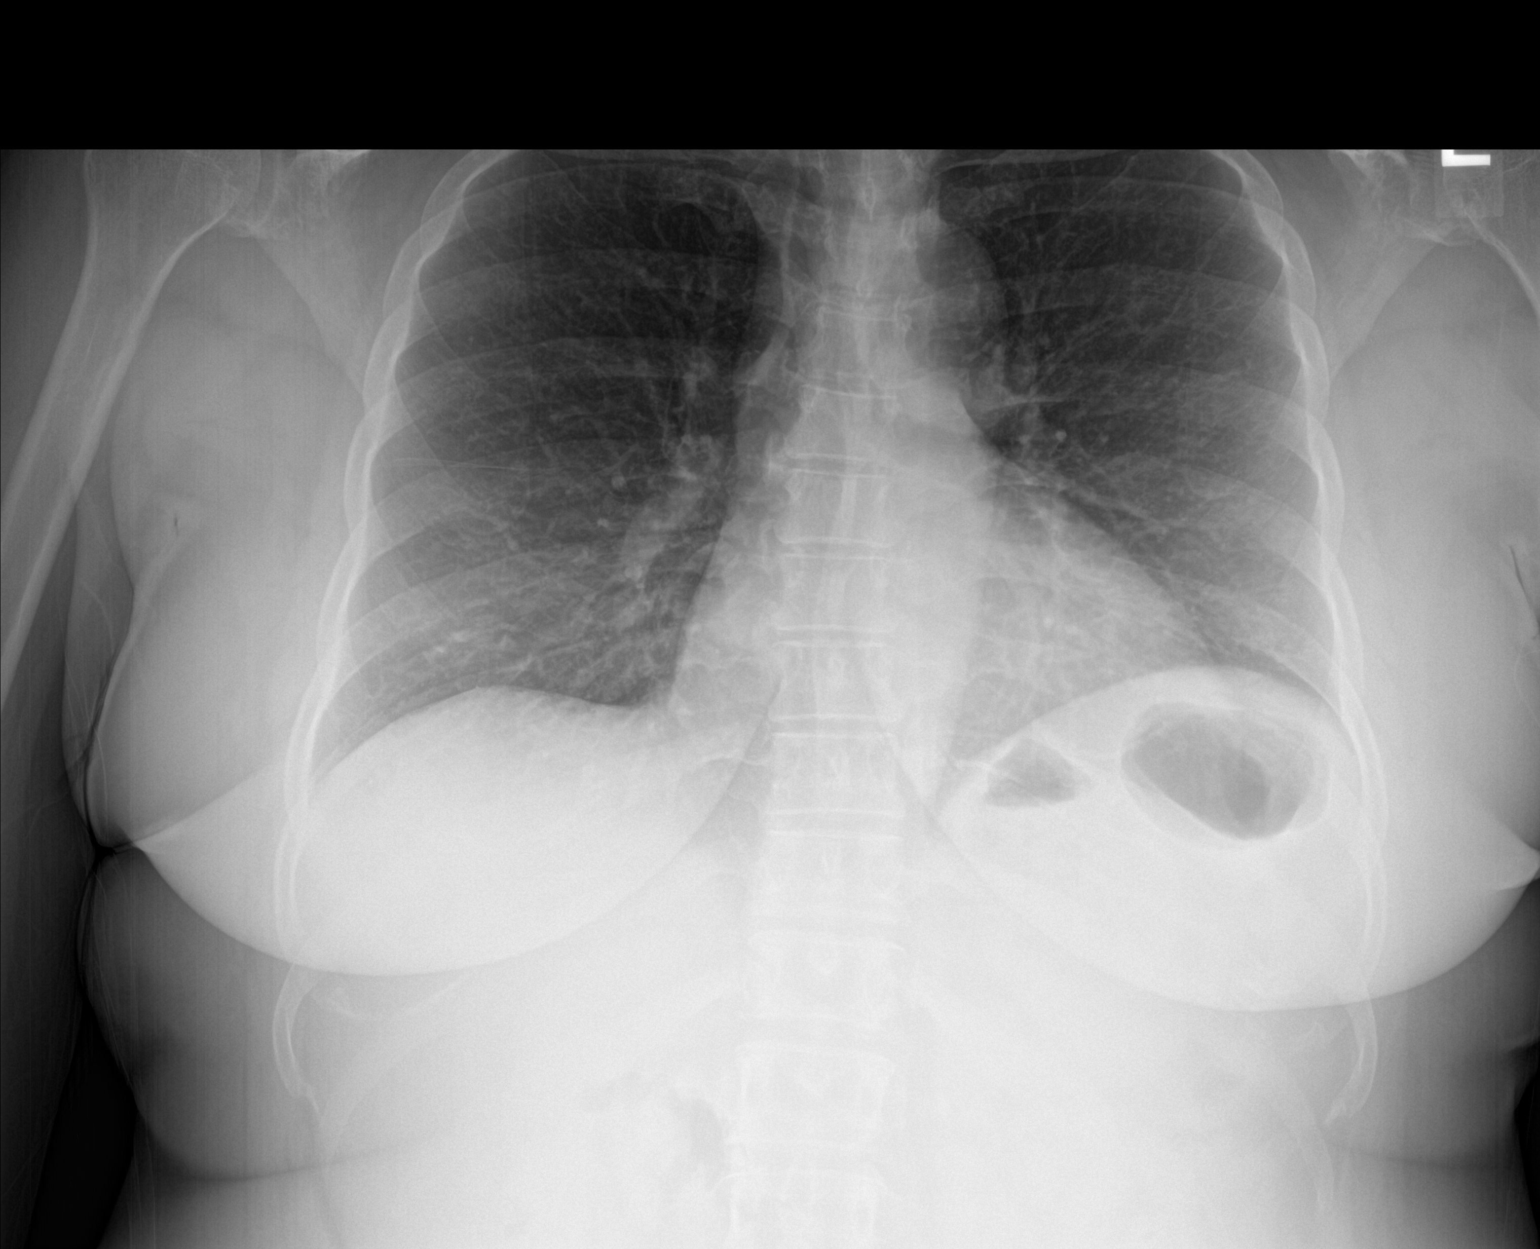

[chest lat]
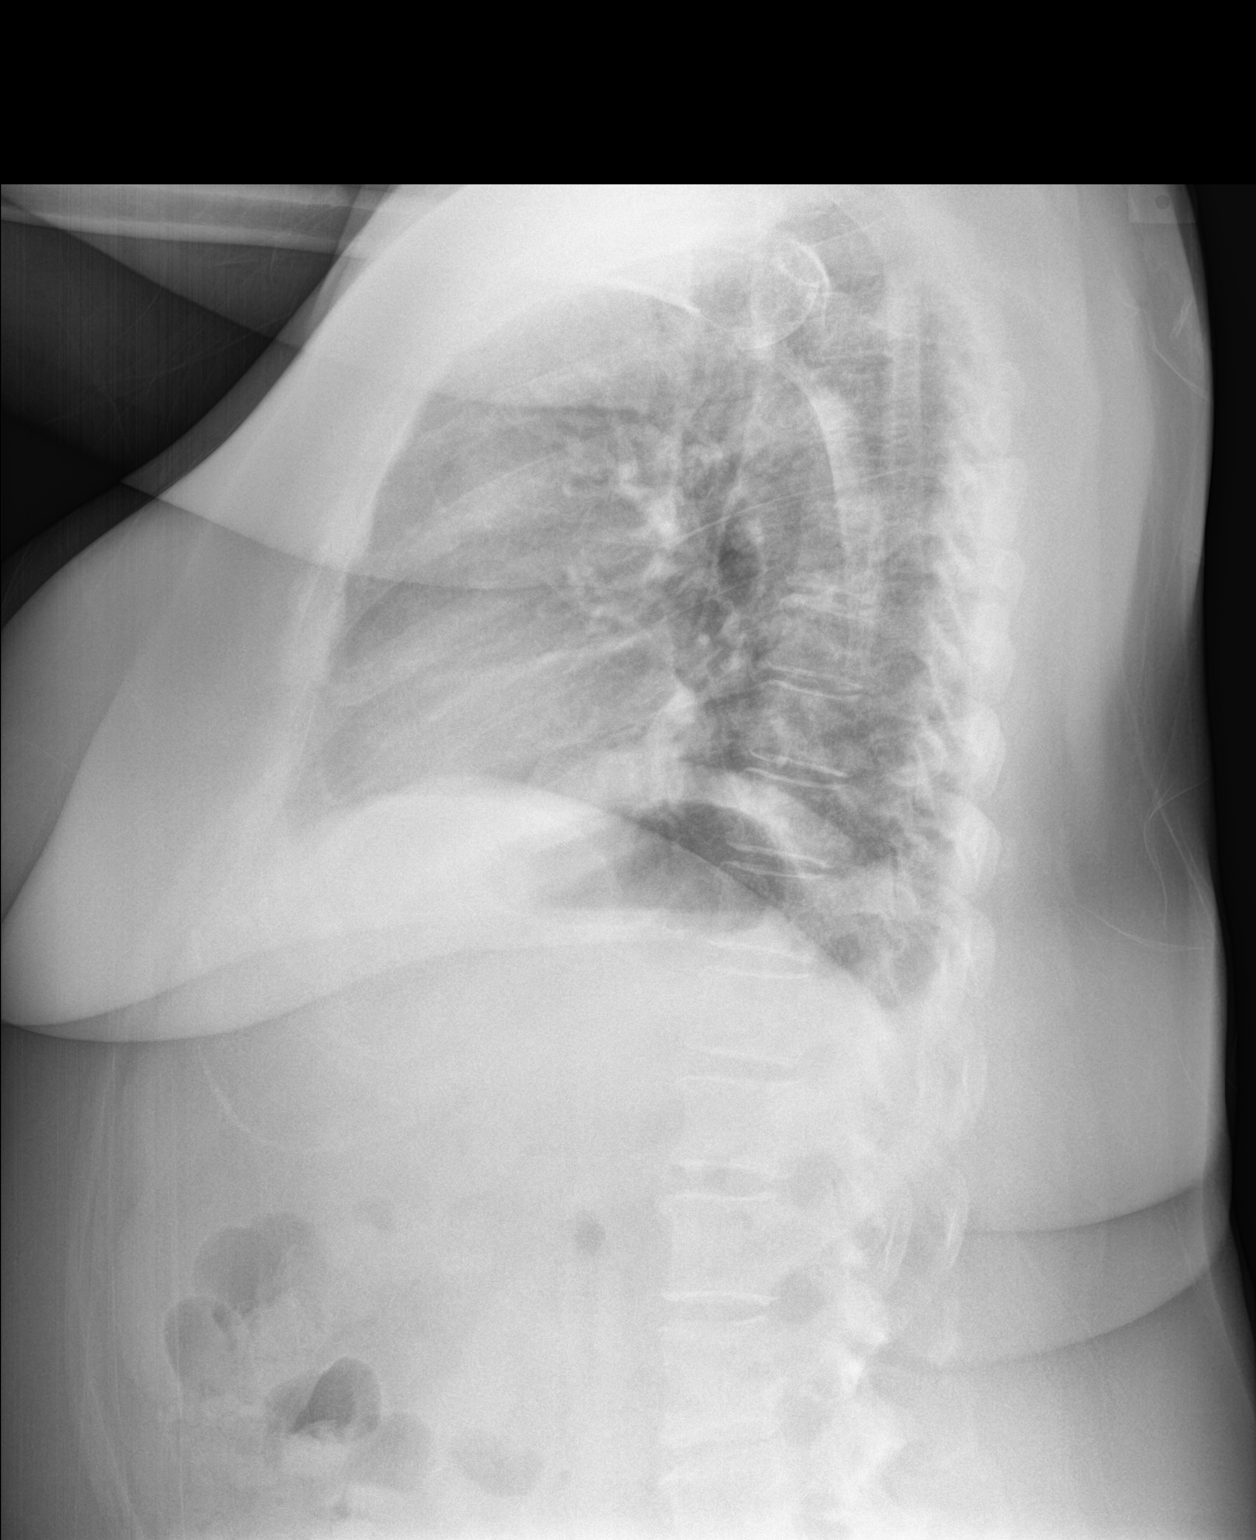

[2 of 2 positions shown; findings below may reference images not displayed]

FINDINGS: The heart size and mediastinal contours are within normal limits.
Both lungs are clear. The visualized skeletal structures are
unremarkable.
IMPRESSION: No active cardiopulmonary disease.

## 2020-08-17 ENCOUNTER — Other Ambulatory Visit: Payer: Self-pay | Admitting: Family Medicine

## 2020-08-17 DIAGNOSIS — M62830 Muscle spasm of back: Secondary | ICD-10-CM

## 2020-08-17 NOTE — Telephone Encounter (Signed)
Requested medication (s) are due for refill today: yes  Requested medication (s) are on the active medication list: yes  Last refill:  04/24/20   Future visit scheduled: yes  Notes to clinic:  med not delegated to NT to RF   Requested Prescriptions  Pending Prescriptions Disp Refills   methocarbamol (ROBAXIN) 750 MG tablet [Pharmacy Med Name: METHOCARBAMOL 750MG  TABLETS] 30 tablet 3    Sig: TAKE 1 TABLET(750 MG) BY MOUTH FOUR TIMES DAILY      Not Delegated - Analgesics:  Muscle Relaxants Failed - 08/17/2020  7:23 AM      Failed - This refill cannot be delegated      Passed - Valid encounter within last 6 months    Recent Outpatient Visits           3 months ago Essential hypertension   [facility] at [facility], Laurita Quint, FNP   8 months ago Cough   Primary [facility] at [facility], Ines Bloomer, MD   10 months ago Essential hypertension   Primary Care at [facility], Lorelee Market, NP   11 months ago Paraspinal muscle spasm   Primary Care at [facility], Lorelee Market, NP   1 year ago Essential hypertension   Primary Care at [facility], Lorelee Market, NP       Future Appointments             In 2 months Just, Laurita Quint, FNP Primary Care at [place], [facility]

## 2020-08-19 NOTE — Telephone Encounter (Signed)
Pt called stating that she is completely out of this medication and is needing to have this sent over today. Please advise.

## 2020-10-02 ENCOUNTER — Other Ambulatory Visit: Payer: Self-pay | Admitting: Family Medicine

## 2020-10-02 DIAGNOSIS — M4186 Other forms of scoliosis, lumbar region: Secondary | ICD-10-CM

## 2020-10-02 DIAGNOSIS — M4156 Other secondary scoliosis, lumbar region: Secondary | ICD-10-CM

## 2020-10-19 ENCOUNTER — Other Ambulatory Visit: Payer: Self-pay | Admitting: Family Medicine

## 2020-10-23 ENCOUNTER — Ambulatory Visit: Payer: Commercial Managed Care - PPO | Admitting: Family Medicine

## 2020-11-12 NOTE — Progress Notes (Signed)
Virtual Visit via Telephone Note  I connected with Janice Jacobs, on 11/13/2020 at 8:41 AM by telephone due to the COVID-19 pandemic and verified that I am speaking with the correct person using two identifiers.  Due to current restrictions/limitations of in-office visits due to the COVID-19 pandemic, this scheduled clinical appointment was converted to a telehealth visit.   Consent: I discussed the limitations, risks, security and privacy concerns of performing an evaluation and management service by telephone and the availability of in person appointments. I also discussed with the patient that there may be a patient responsible charge related to this service. The patient expressed understanding and agreed to proceed.   Location of Patient: Home  Location of Provider: [facility] [place] participating in Telemedicine visit: Lissa Rowles, NP Elmon Else, CMA   History of Present Illness: Janice Jacobs is a 60 year-old female who presents to establish care. PMH significant for essential hypertension, adenomatous polyp of colon, diabetes, arthritis, scoliosis of lumbar region due to degenerative disease of spine in adult, hyperlipidemia, vitamin D deficiency, and paraspinal muscle spasm.  Current issues and/or concerns: 1. DIABETES TYPE 2: Last A1C: 6.7% on 04/24/2020 Med Adherence:  []  Yes  [x]  No. Reports she does like to control things about herself.  Reports she has made significant improvements in her diet and does not feel that she needs Metformin as much. Taking Metformin as needed which is usually when blood sugars over 120. The last time she took Metformin was 1 or 2 weeks ago. She does not take 2 tablets Metformin daily as prescribed. Usually takes 1 tablet Metformin when she does take medication. Other times she is not taking any Metformin.  Medication side effects:  [x]  Yes, does cause some stomach upset which interferes with her  job. Today she was offered to change Metformin to extended-release which may improve some of the stomach side effects, patient declined.  Home Monitoring?  [x]  Yes    []  No Home glucose results range: 90's-120's Diet Adherence: [x]  Yes    []  No Exercise: at work  Hypoglycemic episodes?: []  Yes    [x]  No Last eye exam: 2 years ago  2. HYPERTENSION:   Currently taking: see medication list Med Adherence: []  Yes   [x]  No. Reports taking 1 or 2 times per month. For example reports may take when vision gets blurry. Medication side effects: []  Yes    [x]  No Adherence with salt restriction (low-salt diet): [x]  Yes    []  No Exercise: at work  Home Monitoring?: []  Yes   [x]  No. Reports does check at the local [facility]. Monitoring Frequency: []  Yes   [x]  No Home BP results range: []  Yes   [x]  No, sometimes higher than normal when checking at [facility]  Smoking [x]  Yes, 0.5 pack daily SOB? []  Yes   [x]  No Chest Pain?: []  Yes    [x]  No Leg swelling?: [x]  Yes, sometimes  Headaches?: []  Yes    [x]  No Dizziness? []  Yes    [x]  No  3. BACK PAIN AND MUSCLE SPASMS: History of arthritis and muscle spasms in back. Requesting refills on Meloxicam and Robaxin.      Past Medical History:  Diagnosis Date  . Arthritis   . Blood transfusion without reported diagnosis   . Family hx-breast malignancy   . Hyperlipidemia   . Hypertension   . Scoliosis of lumbar region due to degenerative disease of spine  in adult   . Smoking 1/2 pack a day or less 04/12/2019  . Vitamin D deficiency 05/10/2019   No Known Allergies  Current Outpatient Medications on File Prior to Visit  Medication Sig Dispense Refill  . ACCU-CHEK GUIDE test strip TEST ONCE DAILY 100 strip 3  . Accu-Chek Softclix Lancets lancets daily.    Marland Kitchen atorvastatin (LIPITOR) 20 MG tablet Take 1 tablet (20 mg total) by mouth daily. 90 tablet 3  . Blood Glucose Monitoring Suppl (ACCU-CHEK GUIDE ME) w/Device KIT See admin instructions.    Marland Kitchen  lisinopril-hydrochlorothiazide (ZESTORETIC) 20-25 MG tablet Take 1 tablet by mouth daily. 30 tablet 3  . meloxicam (MOBIC) 15 MG tablet Take 1 tablet (15 mg total) by mouth daily. 30 tablet 3  . metFORMIN (GLUCOPHAGE) 500 MG tablet Take 1 tablet (500 mg total) by mouth daily with breakfast. 90 tablet 3  . methocarbamol (ROBAXIN) 750 MG tablet TAKE 1 TABLET(750 MG) BY MOUTH FOUR TIMES DAILY 30 tablet 3   No current facility-administered medications on file prior to visit.    Observations/Objective: Alert and oriented x 3. Not in acute distress. Physical examination not completed as this is a telemedicine visit.  Assessment and Plan: 1. Encounter to establish care: - Patient presents today to establish care.  - Return for annual physical examination, labs, and health maintenance. Arrive fasting meaning having no food for at least 8 hours prior to appointment. You may have only water or black coffee. Please take scheduled medications as normal.  2. Type 2 diabetes mellitus without complication, without long-term current use of insulin ([place]): 3. Diabetic eye exam ([place]): - Hemoglobin A1c 6.7% on 04/24/2020.  - Patient reports taking Metformin only as needed.  - Counseled on importance of taking Metformin as prescribed. Patient verbalized understanding.  - Offered patient to change to Metformin XR to possibly decrease gastrointestinal effects of regular Metformin. Patient declined.  - Discussed the importance of healthy eating habits, low-carbohydrate diet, low-sugar diet, regular aerobic exercise (at least 150 minutes a week as tolerated) and medication compliance to achieve or maintain control of diabetes. - To achieve an A1C goal of less than or equal to 7.0 percent, a fasting blood sugar of 80 to 130 mg/dL and a postprandial glucose (90 to 120 minutes after a meal) less than 180 mg/dL. In the event of sugars less than 60 mg/dl or greater than 400 mg/dl please notify the clinic ASAP. It is  recommended that you undergo annual eye exams and annual foot exams. - Referral to Ophthalmology for diabetic eye exam. Their office should call patient within 2 weeks with appointment details.  - Follow-up with primary provider in 4 weeks or sooner if needed.  - Ambulatory referral to Ophthalmology  4. Essential hypertension: - Level of blood pressure control unknown as patient not consistently monitoring at home. Encouraged to monitor blood pressure at least 4 times weekly, write down results, and bring to next appointment. Patient agreeable.  - Patient reports taking Lisinopril-Hydrochlorothiazide only as needed.  - Counseled on importance of taking Lisinopril-Hydrochlorothiazide as prescribed. Patient verbalized understanding.  - Counseled on blood pressure goal of less than 130/80, low-sodium, DASH diet, medication compliance, 150 minutes of moderate intensity exercise per week as tolerated. Discussed medication compliance, adverse effects. - Follow-up with primary provider in 4 weeks or sooner if needed.  - lisinopril-hydrochlorothiazide (ZESTORETIC) 20-25 MG tablet; Take 1 tablet by mouth daily.  Dispense: 30 tablet; Refill: 0  5. Scoliosis of lumbar region due to degenerative  disease of spine in adult: 6. Paraspinal muscle spasm: - Continue Meloxicam and Methocarbamol as prescribed.  - Follow-up with primary provider in 4 weeks or sooner if needed.  - meloxicam (MOBIC) 15 MG tablet; Take 1 tablet (15 mg total) by mouth daily.  Dispense: 30 tablet; Refill: 0 - methocarbamol (ROBAXIN) 750 MG tablet; Take 1 tablet (750 mg total) by mouth every 8 (eight) hours as needed for muscle spasms.  Dispense: 30 tablet; Refill: 0   Follow Up Instructions: Return for annual physical exam. Follow-up in 4 weeks for diabetes and hypertension.   Patient was given clear instructions to go to Emergency Department or return to medical center if symptoms don't improve, worsen, or new problems develop.The  patient verbalized understanding.  I discussed the assessment and treatment plan with the patient. The patient was provided an opportunity to ask questions and all were answered. The patient agreed with the plan and demonstrated an understanding of the instructions.   The patient was advised to call back or seek an in-person evaluation if the symptoms worsen or if the condition fails to improve as anticipated.   I provided 20 minutes total of non-face-to-face time during this encounter.   Camillia Herter, NP  [facility] at [facility] [place], [place] 11/13/2020, 8:41 AM

## 2020-11-13 ENCOUNTER — Telehealth (INDEPENDENT_AMBULATORY_CARE_PROVIDER_SITE_OTHER): Payer: Commercial Managed Care - PPO | Admitting: Family

## 2020-11-13 ENCOUNTER — Other Ambulatory Visit: Payer: Self-pay

## 2020-11-13 DIAGNOSIS — E119 Type 2 diabetes mellitus without complications: Secondary | ICD-10-CM

## 2020-11-13 DIAGNOSIS — M62830 Muscle spasm of back: Secondary | ICD-10-CM

## 2020-11-13 DIAGNOSIS — I10 Essential (primary) hypertension: Secondary | ICD-10-CM

## 2020-11-13 DIAGNOSIS — Z7689 Persons encountering health services in other specified circumstances: Secondary | ICD-10-CM

## 2020-11-13 DIAGNOSIS — Z0100 Encounter for examination of eyes and vision without abnormal findings: Secondary | ICD-10-CM | POA: Diagnosis not present

## 2020-11-13 DIAGNOSIS — M4186 Other forms of scoliosis, lumbar region: Secondary | ICD-10-CM

## 2020-11-13 DIAGNOSIS — M4156 Other secondary scoliosis, lumbar region: Secondary | ICD-10-CM

## 2020-11-13 MED ORDER — LISINOPRIL-HYDROCHLOROTHIAZIDE 20-25 MG PO TABS
1.0000 | ORAL_TABLET | Freq: Every day | ORAL | 0 refills | Status: DC
Start: 2020-11-13 — End: 2020-12-25

## 2020-11-13 MED ORDER — MELOXICAM 15 MG PO TABS: 15.0000 mg | ORAL_TABLET | Freq: Every day | ORAL | 0 refills | Status: DC

## 2020-11-13 MED ORDER — METHOCARBAMOL 750 MG PO TABS: 750.0000 mg | ORAL_TABLET | Freq: Three times a day (TID) | ORAL | 0 refills | Status: DC | PRN

## 2020-11-13 NOTE — Progress Notes (Signed)
Establish care Diabetes concerns Lisinopril-hctz, Meloxicam, Methocarbamol needs refills sent into [facility], [place]

## 2020-12-22 ENCOUNTER — Other Ambulatory Visit: Payer: Self-pay | Admitting: Family

## 2020-12-22 DIAGNOSIS — M62830 Muscle spasm of back: Secondary | ICD-10-CM

## 2020-12-23 ENCOUNTER — Telehealth: Payer: Self-pay | Admitting: Family

## 2020-12-23 ENCOUNTER — Other Ambulatory Visit: Payer: Self-pay

## 2020-12-23 DIAGNOSIS — E119 Type 2 diabetes mellitus without complications: Secondary | ICD-10-CM

## 2020-12-23 MED ORDER — METHOCARBAMOL 750 MG PO TABS: 750.0000 mg | ORAL_TABLET | Freq: Three times a day (TID) | ORAL | 1 refills | Status: DC | PRN

## 2020-12-23 MED ORDER — ACCU-CHEK GUIDE VI STRP: ORAL_STRIP | 3 refills | Status: DC

## 2020-12-23 NOTE — Telephone Encounter (Signed)
Per patient request Methocarbamol (Robaxin) refilled. Please schedule appointment for additional refills.

## 2020-12-23 NOTE — Telephone Encounter (Signed)
Patient called in stating she needs refills on her medications (pt states she is out of her test strips)  ACCU-CHEK GUIDE test strip methocarbamol (ROBAXIN) 750 MG tablet    [place] [place] [facility] [place] AT [facility]  [place], [place] [address]  Phone:  [phone]  Fax:  [phone]  DEA #:  [id number]

## 2020-12-24 NOTE — Progress Notes (Signed)
Patient ID: Janice Jacobs, female    DOB: [birth year]  MRN: [id number]  CC: Annual Physical Exam  Subjective: Janice Jacobs is a 60 y.o. female who presents for annual physical exam.   Her concerns today include:  DIABETES TYPE 2 FOLLOW-UP: 11/13/2020: - Hemoglobin A1c 6.7% on 04/24/2020. - Patient reports taking Metformin only as needed. - Counseled on importance of taking Metformin as prescribed. Patient verbalized understanding.  - Offered patient to change to Metformin XR to possibly decrease gastrointestinal effects of regular Metformin. Patient declined.  - Follow-up with primary provider in 4 weeks or sooner if needed.   12/25/2020: Doing well on current regimen. No side effects. No issues/concerns.   2. HYPERTENSION FOLLOW-UP: 11/13/2020: - Patient reports taking Lisinopril-Hydrochlorothiazide only as needed. - Counseled on importance of taking Lisinopril-Hydrochlorothiazide as prescribed. Patient verbalized understanding. - Follow-up with primary provider in 4 weeks or sooner if needed.   12/25/2020: Doing well on current regimen. No side effects. No issues/concerns. Denies chest pain and shortness of breath. Reports she had a piece of country ham prior to today's visit.    Patient Active Problem List   Diagnosis Date Noted   Diabetes ([place]) 04/25/2020   Screening for colon cancer 10/25/2019   Screening for cervical cancer 10/25/2019   Class 1 obesity without serious comorbidity in adult 10/25/2019   Adenomatous polyp of colon 08/30/2019   Paraspinal muscle spasm 08/30/2019   Vitamin D deficiency 05/10/2019   Smoking 1/2 pack a day or less 04/12/2019   Essential hypertension    Scoliosis of lumbar region due to degenerative disease of spine in adult    Hyperlipidemia    Family hx-breast malignancy      Current Outpatient Medications on File Prior to Visit  Medication Sig Dispense Refill   Accu-Chek Softclix Lancets lancets daily.     atorvastatin (LIPITOR) 20 MG  tablet Take 1 tablet (20 mg total) by mouth daily. 90 tablet 3   Blood Glucose Monitoring Suppl (ACCU-CHEK GUIDE ME) w/Device KIT See admin instructions.     glucose blood (ACCU-CHEK GUIDE) test strip TEST ONCE DAILY 100 strip 3   meloxicam (MOBIC) 15 MG tablet Take 1 tablet (15 mg total) by mouth daily. 30 tablet 0   methocarbamol (ROBAXIN) 750 MG tablet Take 1 tablet (750 mg total) by mouth every 8 (eight) hours as needed for muscle spasms. 30 tablet 1   No current facility-administered medications on file prior to visit.    No Known Allergies  Social History   Socioeconomic History   Marital status: Divorced    Spouse name: Not on file   Number of children: Not on file   Years of education: Not on file   Highest education level: Not on file  Occupational History   Not on file  Tobacco Use   Smoking status: Every Day    Packs/day: 0.50    Pack years: 0.00    Types: Cigarettes   Smokeless tobacco: Never  Vaping Use   Vaping Use: Never used  Substance and Sexual Activity   Alcohol use: No   Drug use: No   Sexual activity: Never  Other Topics Concern   Not on file  Social History Narrative   Social Documentation   Social Determinants of Health   Financial Resource Strain: Not on file  Food Insecurity: Not on file  Transportation Needs: Not on file  Physical Activity: Not on file  Stress: Not on file  Social Connections: Not on file  Intimate Partner Violence: Not on file    Family History  Problem Relation Age of Onset   Colon cancer Neg Hx    Colon polyps Neg Hx    Esophageal cancer Neg Hx    Rectal cancer Neg Hx    Stomach cancer Neg Hx     Past Surgical History:  Procedure Laterality Date   ABDOMINAL HYSTERECTOMY      ROS: Review of Systems Negative except as stated above  PHYSICAL EXAM: BP (!) 136/92 (BP Location: Left Arm, Patient Position: Sitting, Cuff Size: Normal)   Pulse 75   Temp 98.1 F (36.7 C)   Resp 16   Ht 5' (1.524 m)   Wt 185  lb (83.9 kg)   SpO2 98%   BMI 36.13 kg/m   Physical Exam HENT:     Head: Normocephalic and atraumatic.     Right Ear: Tympanic membrane, ear canal and external ear normal.     Left Ear: Tympanic membrane, ear canal and external ear normal.  Eyes:     Extraocular Movements: Extraocular movements intact.     Conjunctiva/sclera: Conjunctivae normal.     Pupils: Pupils are equal, round, and reactive to light.  Cardiovascular:     Rate and Rhythm: Normal rate and regular rhythm.  Pulmonary:     Effort: Pulmonary effort is normal.     Breath sounds: Normal breath sounds.  Chest:     Comments: Patient declined exam.  Abdominal:     General: Bowel sounds are normal.     Palpations: Abdomen is soft.  Genitourinary:    Comments: Patient declined exam.  Musculoskeletal:        General: Normal range of motion.     Cervical back: Normal range of motion and neck supple.  Skin:    General: Skin is warm and dry.     Capillary Refill: Capillary refill takes less than 2 seconds.  Neurological:     General: No focal deficit present.     Mental Status: She is alert and oriented to person, place, and time.  Psychiatric:        Mood and Affect: Mood normal.        Behavior: Behavior normal.   ASSESSMENT AND PLAN: 1. Annual physical exam: - Counseled on 150 minutes of exercise per week as tolerated, healthy eating (including decreased daily intake of saturated fats, cholesterol, added sugars, sodium), STI prevention, and routine healthcare maintenance.  2. Screening for metabolic disorder: - RFF63+WGYK to check kidney function, liver function, and electrolyte balance.  - CMP14+EGFR  3. Screening for deficiency anemia: - CBC to screen for anemia. - CBC  4. Thyroid disorder screen: - TSH to check thyroid function.  - TSH  5. Encounter for screening mammogram for malignant neoplasm of breast: - Referral for breast cancer screening by mammogram.  - MM Digital Screening; Future  6. Pap  smear for cervical cancer screening: 7. Routine screening for STI (sexually transmitted infection) - Patient declined and plans to schedule follow-up appointment to have completed in office.  8. Need for prophylactic vaccination against Streptococcus pneumoniae (pneumococcus): - Administered today in office.  9. Essential hypertension: - Blood pressure close to goal. Level of blood pressure control unknown as patient does not monitor in the home setting.  Patient had country ham prior to today's visit.  - Continue Lisinopril-Hydrochlorothiazide as prescribed. - Counseled on blood pressure goal of less than 130/80, low-sodium, DASH diet, medication compliance, 150 minutes of moderate intensity exercise per  week as tolerated. Discussed medication compliance, adverse effects. - Follow-up with primary provider in 3 months or sooner if needed.  - lisinopril-hydrochlorothiazide (ZESTORETIC) 20-25 MG tablet; Take 1 tablet by mouth daily.  Dispense: 90 tablet; Refill: 0  10. Type 2 diabetes mellitus without complication, without long-term current use of insulin ([place]): - Hemoglobin A1c at goal today at 6.2%, goal < 7%. This is improved from previous hemoglobin A1c of 6.7% on 04/24/2020. Next hemoglobin A1c due October 2022.  - Continue Metformin as prescribed.  - Discussed the importance of healthy eating habits, low-carbohydrate diet, low-sugar diet, regular aerobic exercise (at least 150 minutes a week as tolerated) and medication compliance to achieve or maintain control of diabetes. - Follow-up with primary provider in 3 months or sooner if needed.  - POCT glycosylated hemoglobin (Hb A1C) - metFORMIN (GLUCOPHAGE) 500 MG tablet; Take 1 tablet (500 mg total) by mouth daily with breakfast.  Dispense: 90 tablet; Refill: 0  11. Hyperlipidemia, unspecified hyperlipidemia type: -Practice low-fat heart healthy diet and at least 150 minutes of moderate intensity exercise weekly as tolerated.  - Continue  Atorvastatin as prescribed.  - Lipid panel to screen for level of cholesterol control. - Follow-up with primary provider as scheduled.  - Lipid panel    Patient was given the opportunity to ask questions.  Patient verbalized understanding of the plan and was able to repeat key elements of the plan. Patient was given clear instructions to go to Emergency Department or return to medical center if symptoms don't improve, worsen, or new problems develop.The patient verbalized understanding.   Orders Placed This Encounter  Procedures   MM Digital Screening   CBC   Lipid panel   TSH   CMP14+EGFR   POCT glycosylated hemoglobin (Hb A1C)     Requested Prescriptions   Signed Prescriptions Disp Refills   metFORMIN (GLUCOPHAGE) 500 MG tablet 90 tablet 0    Sig: Take 1 tablet (500 mg total) by mouth daily with breakfast.   lisinopril-hydrochlorothiazide (ZESTORETIC) 20-25 MG tablet 90 tablet 0    Sig: Take 1 tablet by mouth daily.    Return in about 3 months (around 03/27/2021) for Follow-Up hypertension and diabetes.  Camillia Herter, NP

## 2020-12-25 ENCOUNTER — Encounter: Payer: Self-pay | Admitting: Family

## 2020-12-25 ENCOUNTER — Ambulatory Visit (INDEPENDENT_AMBULATORY_CARE_PROVIDER_SITE_OTHER): Payer: Commercial Managed Care - PPO | Admitting: Family

## 2020-12-25 ENCOUNTER — Other Ambulatory Visit: Payer: Self-pay

## 2020-12-25 VITALS — BP 136/92 | HR 75 | Temp 98.1°F | Resp 16 | Ht 60.0 in | Wt 185.0 lb

## 2020-12-25 DIAGNOSIS — Z0000 Encounter for general adult medical examination without abnormal findings: Secondary | ICD-10-CM

## 2020-12-25 DIAGNOSIS — Z1329 Encounter for screening for other suspected endocrine disorder: Secondary | ICD-10-CM | POA: Diagnosis not present

## 2020-12-25 DIAGNOSIS — Z1231 Encounter for screening mammogram for malignant neoplasm of breast: Secondary | ICD-10-CM

## 2020-12-25 DIAGNOSIS — Z13228 Encounter for screening for other metabolic disorders: Secondary | ICD-10-CM | POA: Diagnosis not present

## 2020-12-25 DIAGNOSIS — Z23 Encounter for immunization: Secondary | ICD-10-CM

## 2020-12-25 DIAGNOSIS — E785 Hyperlipidemia, unspecified: Secondary | ICD-10-CM

## 2020-12-25 DIAGNOSIS — E119 Type 2 diabetes mellitus without complications: Secondary | ICD-10-CM | POA: Diagnosis not present

## 2020-12-25 DIAGNOSIS — Z113 Encounter for screening for infections with a predominantly sexual mode of transmission: Secondary | ICD-10-CM

## 2020-12-25 DIAGNOSIS — Z130 Encounter for screening for diseases of the blood and blood-forming organs and certain disorders involving the immune mechanism: Secondary | ICD-10-CM

## 2020-12-25 DIAGNOSIS — I10 Essential (primary) hypertension: Secondary | ICD-10-CM

## 2020-12-25 DIAGNOSIS — Z124 Encounter for screening for malignant neoplasm of cervix: Secondary | ICD-10-CM

## 2020-12-25 LAB — POCT GLYCOSYLATED HEMOGLOBIN (HGB A1C): Hemoglobin A1C: 6.2 % — AB (ref 4.0–5.6)

## 2020-12-25 MED ORDER — METFORMIN HCL 500 MG PO TABS: 500.0000 mg | ORAL_TABLET | Freq: Every day | ORAL | 0 refills | Status: DC

## 2020-12-25 MED ORDER — LISINOPRIL-HYDROCHLOROTHIAZIDE 20-25 MG PO TABS: 1.0000 | ORAL_TABLET | Freq: Every day | ORAL | 0 refills | Status: DC

## 2020-12-25 NOTE — Patient Instructions (Signed)
Preventive Care 58-58 Years Old, Female Preventive care refers to lifestyle choices and visits with your health care provider that can promote health and wellness. This includes: A yearly physical exam. This is also called an annual wellness visit. Regular dental and eye exams. Immunizations. Screening for certain conditions. Healthy lifestyle choices, such as: Eating a healthy diet. Getting regular exercise. Not using drugs or products that contain nicotine and tobacco. Limiting alcohol use. What can I expect for my preventive care visit? Physical exam Your health care provider will check your: Height and weight. These may be used to calculate your BMI (body mass index). BMI is a measurement that tells if you are at a healthy weight. Heart rate and blood pressure. Body temperature. Skin for abnormal spots. Counseling Your health care provider may ask you questions about your: Past medical problems. Family's medical history. Alcohol, tobacco, and drug use. Emotional well-being. Home life and relationship well-being. Sexual activity. Diet, exercise, and sleep habits. Work and work Statistician. Access to firearms. Method of birth control. Menstrual cycle. Pregnancy history. What immunizations do I need?  Vaccines are usually given at various ages, according to a schedule. Your health care provider will recommend vaccines for you based on your age, medicalhistory, and lifestyle or other factors, such as travel or where you work. What tests do I need? Blood tests Lipid and cholesterol levels. These may be checked every 5 years, or more often if you are over 58 years old. Hepatitis C test. Hepatitis B test. Screening Lung cancer screening. You may have this screening every year starting at age 58 if you have a 30-pack-year history of smoking and currently smoke or have quit within the past 15 years. Colorectal cancer screening. All adults should have this screening starting at  age 58 and continuing until age 58. Your health care provider may recommend screening at age 58 if you are at increased risk. You will have tests every 1-10 years, depending on your results and the type of screening test. Diabetes screening. This is done by checking your blood sugar (glucose) after you have not eaten for a while (fasting). You may have this done every 1-3 years. Mammogram. This may be done every 1-2 years. Talk with your health care provider about when you should start having regular mammograms. This may depend on whether you have a family history of breast cancer. BRCA-related cancer screening. This may be done if you have a family history of breast, ovarian, tubal, or peritoneal cancers. Pelvic exam and Pap test. This may be done every 3 years starting at age 58. Starting at age 58, this may be done every 5 years if you have a Pap test in combination with an HPV test. Other tests STD (sexually transmitted disease) testing, if you are at risk. Bone density scan. This is done to screen for osteoporosis. You may have this scan if you are at high risk for osteoporosis. Talk with your health care provider about your test results, treatment options,and if necessary, the need for more tests. Follow these instructions at home: Eating and drinking  Eat a diet that includes fresh fruits and vegetables, whole grains, lean protein, and low-fat dairy products. Take vitamin and mineral supplements as recommended by your health care provider. Do not drink alcohol if: Your health care provider tells you not to drink. You are pregnant, may be pregnant, or are planning to become pregnant. If you drink alcohol: Limit how much you have to 0-1 drink a day. Be aware  of how much alcohol is in your drink. In the [place] one drink equals one 12 oz bottle of beer (355 mL), one 5 oz glass of wine (148 mL), or one 1 oz glass of hard liquor (44 mL).  Lifestyle Take daily care of your teeth and  gums. Brush your teeth every morning and night with fluoride toothpaste. Floss one time each day. Stay active. Exercise for at least 30 minutes 5 or more days each week. Do not use any products that contain nicotine or tobacco, such as cigarettes, e-cigarettes, and chewing tobacco. If you need help quitting, ask your health care provider. Do not use drugs. If you are sexually active, practice safe sex. Use a condom or other form of protection to prevent STIs (sexually transmitted infections). If you do not wish to become pregnant, use a form of birth control. If you plan to become pregnant, see your health care provider for a prepregnancy visit. If told by your health care provider, take low-dose aspirin daily starting at age 58. Find healthy ways to cope with stress, such as: Meditation, yoga, or listening to music. Journaling. Talking to a trusted person. Spending time with friends and family. Safety Always wear your seat belt while driving or riding in a vehicle. Do not drive: If you have been drinking alcohol. Do not ride with someone who has been drinking. When you are tired or distracted. While texting. Wear a helmet and other protective equipment during sports activities. If you have firearms in your house, make sure you follow all gun safety procedures. What's next? Visit your health care provider once a year for an annual wellness visit. Ask your health care provider how often you should have your eyes and teeth checked. Stay up to date on all vaccines. This information is not intended to replace advice given to you by your health care provider. Make sure you discuss any questions you have with your healthcare provider. Document Revised: 03/05/2020 Document Reviewed: 02/10/2018 Elsevier Patient Education  2022 [employer].

## 2020-12-25 NOTE — Progress Notes (Signed)
Pt presents for annual physical, pt declined pap did not have time to complete

## 2020-12-26 ENCOUNTER — Other Ambulatory Visit: Payer: Self-pay | Admitting: Family

## 2020-12-26 ENCOUNTER — Other Ambulatory Visit: Payer: Self-pay

## 2020-12-26 DIAGNOSIS — M4186 Other forms of scoliosis, lumbar region: Secondary | ICD-10-CM

## 2020-12-26 DIAGNOSIS — R7989 Other specified abnormal findings of blood chemistry: Secondary | ICD-10-CM

## 2020-12-26 DIAGNOSIS — M4156 Other secondary scoliosis, lumbar region: Secondary | ICD-10-CM

## 2020-12-26 DIAGNOSIS — E782 Mixed hyperlipidemia: Secondary | ICD-10-CM

## 2020-12-26 DIAGNOSIS — I10 Essential (primary) hypertension: Secondary | ICD-10-CM

## 2020-12-26 LAB — CMP14+EGFR
ALT: 8 IU/L (ref 0–32)
AST: 9 IU/L (ref 0–40)
Albumin/Globulin Ratio: 1.6 (ref 1.2–2.2)
Albumin: 4.2 g/dL (ref 3.8–4.9)
Alkaline Phosphatase: 86 IU/L (ref 44–121)
BUN/Creatinine Ratio: 19 (ref 9–23)
BUN: 18 mg/dL (ref 6–24)
Bilirubin Total: <0.2 mg/dL (ref 0.0–1.2)
CO2: 22 mmol/L (ref 20–29)
Calcium: 9.2 mg/dL (ref 8.7–10.2)
Chloride: 105 mmol/L (ref 96–106)
Creatinine, Ser: 0.93 mg/dL (ref 0.57–1.00)
Globulin, Total: 2.7 g/dL (ref 1.5–4.5)
Glucose: 99 mg/dL (ref 65–99)
Potassium: 4.3 mmol/L (ref 3.5–5.2)
Sodium: 141 mmol/L (ref 134–144)
Total Protein: 6.9 g/dL (ref 6.0–8.5)
eGFR: 72 mL/min/{1.73_m2} (ref >59)

## 2020-12-26 LAB — CBC
Hematocrit: 44.4 % (ref 34.0–46.6)
Hemoglobin: 14.1 g/dL (ref 11.1–15.9)
MCH: 29.9 pg (ref 26.6–33.0)
MCHC: 31.8 g/dL (ref 31.5–35.7)
MCV: 94 fL (ref 79–97)
Platelets: 272 10*3/uL (ref 150–450)
RBC: 4.72 x10E6/uL (ref 3.77–5.28)
RDW: 13.7 % (ref 11.7–15.4)
WBC: 8.4 10*3/uL (ref 3.4–10.8)

## 2020-12-26 LAB — LIPID PANEL
Chol/HDL Ratio: 6.5 ratio — ABNORMAL HIGH (ref 0.0–4.4)
Cholesterol, Total: 220 mg/dL — ABNORMAL HIGH (ref 100–199)
HDL: 34 mg/dL — ABNORMAL LOW (ref >39)
LDL Chol Calc (NIH): 150 mg/dL — ABNORMAL HIGH (ref 0–99)
Triglycerides: 197 mg/dL — ABNORMAL HIGH (ref 0–149)
VLDL Cholesterol Cal: 36 mg/dL (ref 5–40)

## 2020-12-26 LAB — TSH: TSH: 0.432 u[IU]/mL — ABNORMAL LOW (ref 0.450–4.500)

## 2020-12-26 MED ORDER — MELOXICAM 15 MG PO TABS: 15.0000 mg | ORAL_TABLET | Freq: Every day | ORAL | 0 refills | Status: DC

## 2020-12-26 MED ORDER — ATORVASTATIN CALCIUM 40 MG PO TABS: 40.0000 mg | ORAL_TABLET | Freq: Every day | ORAL | 0 refills | Status: DC

## 2020-12-26 NOTE — Progress Notes (Signed)
Meloxicam refilled for pt

## 2020-12-26 NOTE — Progress Notes (Signed)
Kidney function normal.   Liver function normal.   No anemia.   Diabetes discussed in office.   Thyroid lower than normal. Plan to recheck thyroid at lab only visit in 4 to 6 weeks. Also, referral to Endocrinology for further evaluation and management. Their office should call patient within 2 weeks with appointment details.   Cholesterol higher than expected. High cholesterol may increase risk of heart attack and/or stroke. Consider eating more fruits, vegetables, and lean baked meats such as chicken or fish. Moderate intensity exercise at least 150 minutes as tolerated per week may help as well. Increase Atorvastatin from 20 mg daily to 40 mg daily. Encouraged to recheck in 3 to 6 months.  The following is for provider reference only: The 10-year ASCVD risk score  is: 42.3%   Values used to calculate the score:     Age: 60 years     Sex: Female     Is Non-Hispanic African American: Yes     Diabetic: Yes     Tobacco smoker: Yes     Systolic Blood Pressure: 397 mmHg     Is BP treated: Yes     HDL Cholesterol: 34 mg/dL     Total Cholesterol: 220 mg/dL

## 2021-01-08 ENCOUNTER — Encounter: Payer: Self-pay | Admitting: Endocrinology

## 2021-01-13 ENCOUNTER — Other Ambulatory Visit: Payer: Self-pay | Admitting: Emergency Medicine

## 2021-01-13 DIAGNOSIS — M4186 Other forms of scoliosis, lumbar region: Secondary | ICD-10-CM

## 2021-01-13 DIAGNOSIS — M4156 Other secondary scoliosis, lumbar region: Secondary | ICD-10-CM

## 2021-01-18 ENCOUNTER — Other Ambulatory Visit: Payer: Self-pay | Admitting: Family

## 2021-01-18 DIAGNOSIS — M62830 Muscle spasm of back: Secondary | ICD-10-CM

## 2021-01-22 ENCOUNTER — Ambulatory Visit: Payer: Commercial Managed Care - PPO

## 2021-02-05 ENCOUNTER — Telehealth: Payer: Self-pay | Admitting: Family

## 2021-02-05 NOTE — Telephone Encounter (Signed)
Pt came in about bill but also about getting the correct diabetes device ordered

## 2021-02-06 NOTE — Telephone Encounter (Signed)
Pt was new pt to Durene Fruits, NP on 12/04/20, would need to know what diabetes monitor insurance will pay for and will update at that time

## 2021-03-27 NOTE — Progress Notes (Signed)
Erroneous encounter

## 2021-04-02 ENCOUNTER — Other Ambulatory Visit: Payer: Self-pay | Admitting: Family

## 2021-04-02 ENCOUNTER — Encounter: Payer: Commercial Managed Care - PPO | Admitting: Family

## 2021-04-02 DIAGNOSIS — E782 Mixed hyperlipidemia: Secondary | ICD-10-CM

## 2021-04-02 DIAGNOSIS — E119 Type 2 diabetes mellitus without complications: Secondary | ICD-10-CM

## 2021-04-02 DIAGNOSIS — I10 Essential (primary) hypertension: Secondary | ICD-10-CM

## 2021-04-02 DIAGNOSIS — M4156 Other secondary scoliosis, lumbar region: Secondary | ICD-10-CM

## 2021-04-02 DIAGNOSIS — E785 Hyperlipidemia, unspecified: Secondary | ICD-10-CM

## 2021-04-04 ENCOUNTER — Encounter: Payer: Self-pay | Admitting: Family

## 2021-04-04 MED ORDER — LISINOPRIL-HYDROCHLOROTHIAZIDE 20-25 MG PO TABS: 1.0000 | ORAL_TABLET | Freq: Every day | ORAL | 0 refills | Status: DC

## 2021-04-04 MED ORDER — METFORMIN HCL 500 MG PO TABS: 500.0000 mg | ORAL_TABLET | Freq: Every day | ORAL | 0 refills | Status: DC

## 2021-04-04 MED ORDER — MELOXICAM 15 MG PO TABS: 15.0000 mg | ORAL_TABLET | Freq: Every day | ORAL | 0 refills | Status: AC

## 2021-04-04 MED ORDER — ATORVASTATIN CALCIUM 40 MG PO TABS: 40.0000 mg | ORAL_TABLET | Freq: Every day | ORAL | 0 refills | Status: DC

## 2021-04-04 NOTE — Telephone Encounter (Signed)
Meloxicam (Mobic) refilled for 30 day courtesy. Last appointment related to requested medication 11/13/2020. Please schedule appointment for additional refills.   Metformin (Glucophage) and Atorvastatin (Lipitor) refilled for 30 day courtesy. Last appointment related to requested medication 12/25/2020. Hemoglobin A1c due. Please schedule appointment for additional refills.

## 2021-04-11 ENCOUNTER — Telehealth: Payer: Self-pay | Admitting: Family

## 2021-04-11 NOTE — Telephone Encounter (Signed)
Pt was scheduled for 11/2 for new pt appt due to provider not in office we had to resched for 11/9 at 1. Pt will need med refill before appointment.   Medication: methocarbamol (ROBAXIN) 750 MG tablet metFORMIN (GLUCOPHAGE) 500 MG tablet      Has the patient contacted their pharmacy? No. (If no, request that the patient contact the pharmacy for the refill.) (If yes, when and what did the pharmacy advise?)     Preferred Pharmacy (with phone number or street name):  [facility] [place] [address], [place] [address]  Phone:  [phone]  Fax:  [phone]     Agent: Please be advised that RX refills may take up to 3 business days. We ask that you follow-up with your pharmacy.

## 2021-04-14 NOTE — Telephone Encounter (Signed)
Spoke with pt made her aware , previous provider filled medications on 10/21 , pt states she wasn't aware and would call pharmacy and if there is an issue she would call us back

## 2021-04-16 ENCOUNTER — Ambulatory Visit: Payer: Commercial Managed Care - PPO | Admitting: Medical

## 2021-04-16 ENCOUNTER — Other Ambulatory Visit: Payer: Self-pay | Admitting: Family

## 2021-04-16 DIAGNOSIS — M4156 Other secondary scoliosis, lumbar region: Secondary | ICD-10-CM

## 2021-04-16 DIAGNOSIS — E119 Type 2 diabetes mellitus without complications: Secondary | ICD-10-CM

## 2021-04-23 ENCOUNTER — Ambulatory Visit: Payer: Commercial Managed Care - PPO | Admitting: Medical

## 2021-04-23 ENCOUNTER — Other Ambulatory Visit: Payer: Self-pay

## 2021-04-23 ENCOUNTER — Encounter: Payer: Self-pay | Admitting: Medical

## 2021-04-23 VITALS — BP 180/90 | HR 82 | Temp 98.6°F | Ht 60.0 in | Wt 183.8 lb

## 2021-04-23 DIAGNOSIS — I10 Essential (primary) hypertension: Secondary | ICD-10-CM

## 2021-04-23 DIAGNOSIS — E782 Mixed hyperlipidemia: Secondary | ICD-10-CM | POA: Diagnosis not present

## 2021-04-23 DIAGNOSIS — E119 Type 2 diabetes mellitus without complications: Secondary | ICD-10-CM | POA: Diagnosis not present

## 2021-04-23 DIAGNOSIS — R7989 Other specified abnormal findings of blood chemistry: Secondary | ICD-10-CM | POA: Diagnosis not present

## 2021-04-23 MED ORDER — LISINOPRIL-HYDROCHLOROTHIAZIDE 20-25 MG PO TABS: 1.0000 | ORAL_TABLET | Freq: Every day | ORAL | 0 refills | Status: DC

## 2021-04-23 NOTE — Addendum Note (Signed)
Addended by: Anabel Halon on: 04/23/2021 02:21 PM   Modules accepted: Orders

## 2021-04-23 NOTE — Patient Instructions (Addendum)
Hypertension with very high blood pressure daily.  But no cardiac or neurologic signs and symptoms.  Good good/normal neurologic exam.  He had not been on blood pressure medication for more than 24 hours.  Please take your BP medication immediately when you get your car.  Then resume blood pressure medication daily tomorrow morning.  I recommend that you get electronic blood pressure cuff over-the-counter and check blood pressures daily.  Document those readings.  If you have any cardiac or neurologic signs and symptoms then recommend ED evaluation.  Diabetes with most recent A1c well controlled.  Continue metformin and will get metabolic panel and J0L today.  Hyperlipidemia.  Continue atorvastatin.  Last lipid panel showed elevated lipid levels.  We will repeat lipid panel today.  On review of prior labs low TSH.  We will get T4 and TSH today.  Might refer to endocrinologist as labs are normal.  Follow-up this coming Monday 3 PM.  We will plan to do wellness exam at that time and try to update your health maintenance.

## 2021-04-23 NOTE — Progress Notes (Signed)
Subjective:    Patient ID: Janice Jacobs, female    DOB: 09/20/1960, 60 y.o.   MRN: [id number]  HPI  Pt in for first time.   Pt works at [employer], [place] does not exercise daily but she is on her feet all day long. 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 Pt does not drink caffeine beverage. She smokes half pack new ports. Smoking since 60 yo. Did quit when she was pregnant with 3 daughters. Pt admits does not moderate healthy diet. Eats occasional red meat. No pork.    It has been a while since I saw her. She left went to other office and now back.   Pt has hx of htn, diabetes and high cholesterol.    Pt last a1c was 6.2 on 12/25/2020.  Last lipid panel showed high levels. Pt is on atorvastatin 40 mg daily.  Htn- on zestoretic. 20/25 1 tab po q day. No cardiac or neuorlogic signs/symptoms. Pt last time she took bp medication was yesterday morning.   Pt has some joint pain. Knees and shoulder pains. On meloxicam. Only takes as needed. Last time took was 2 weeks ago.      Review of Systems  Constitutional:  Negative for chills, fatigue and fever.  HENT:  Negative for congestion and drooling.   Respiratory:  Negative for cough, chest tightness, shortness of breath and wheezing.   Gastrointestinal:  Negative for abdominal pain.  Musculoskeletal:  Negative for arthralgias, back pain and gait problem.  Skin:  Negative for rash.  Neurological:  Negative for dizziness, numbness and headaches.  Hematological:  Negative for adenopathy. Does not bruise/bleed easily.  Psychiatric/Behavioral:  Negative for behavioral problems, hallucinations and sleep disturbance. The patient is not nervous/anxious and is not hyperactive.      Past Medical History:  Diagnosis Date   Arthritis    Blood transfusion without reported diagnosis    Family hx-breast malignancy    Hyperlipidemia    Hypertension    Scoliosis of lumbar region due to degenerative disease of spine in adult    Smoking 1/2 pack a  day or less 04/12/2019   Vitamin D deficiency 05/10/2019     Social History   Socioeconomic History   Marital status: Divorced    Spouse name: Not on file   Number of children: Not on file   Years of education: Not on file   Highest education level: Not on file  Occupational History   Not on file  Tobacco Use   Smoking status: Every Day    Packs/day: 0.50    Types: Cigarettes   Smokeless tobacco: Never  Vaping Use   Vaping Use: Never used  Substance and Sexual Activity   Alcohol use: No   Drug use: No   Sexual activity: Never  Other Topics Concern   Not on file  Social History Narrative   Social Documentation   Social Determinants of Health   Financial Resource Strain: Not on file  Food Insecurity: Not on file  Transportation Needs: Not on file  Physical Activity: Not on file  Stress: Not on file  Social Connections: Not on file  Intimate Partner Violence: Not on file    Past Surgical History:  Procedure Laterality Date   ABDOMINAL HYSTERECTOMY      Family History  Problem Relation Age of Onset   Colon cancer Neg Hx    Colon polyps Neg Hx    Esophageal cancer Neg Hx    Rectal  cancer Neg Hx    Stomach cancer Neg Hx     No Known Allergies  Current Outpatient Medications on File Prior to Visit  Medication Sig Dispense Refill   Accu-Chek Softclix Lancets lancets daily.     atorvastatin (LIPITOR) 40 MG tablet Take 1 tablet (40 mg total) by mouth daily. 120 tablet 0   Blood Glucose Monitoring Suppl (ACCU-CHEK GUIDE ME) w/Device KIT See admin instructions.     glucose blood (ACCU-CHEK GUIDE) test strip TEST ONCE DAILY 100 strip 3   lisinopril-hydrochlorothiazide (ZESTORETIC) 20-25 MG tablet Take 1 tablet by mouth daily. 90 tablet 0   meloxicam (MOBIC) 15 MG tablet Take 1 tablet (15 mg total) by mouth daily. 30 tablet 0   metFORMIN (GLUCOPHAGE) 500 MG tablet Take 1 tablet (500 mg total) by mouth daily with breakfast. 90 tablet 0   methocarbamol (ROBAXIN) 750  MG tablet TAKE 1 TABLET(750 MG) BY MOUTH Q8H AS NEEDED FOR MUSCLE SPASMS 30 tablet 1   No current facility-administered medications on file prior to visit.    BP (!) 180/96   Pulse 82   Temp 98.6 F (37 C)   Ht 5' (1.524 m)   Wt 183 lb 12.8 oz (83.4 kg)   SpO2 99%   BMI 35.90 kg/m        Objective:   Physical Exam   General Mental Status- Alert. General Appearance- Not in acute distress.   Skin General: Color- Normal Color. Moisture- Normal Moisture.  Neck Carotid Arteries- Normal color. Moisture- Normal Moisture. No carotid bruits. No JVD.  Chest and Lung Exam Auscultation: Breath Sounds:-Normal.  Cardiovascular Auscultation:Rythm- Regular. Murmurs & Other Heart Sounds:Auscultation of the heart reveals- No Murmurs.  Abdomen Inspection:-Inspeection Normal. Palpation/Percussion:Note:No mass. Palpation and Percussion of the abdomen reveal- Non Tender, Non Distended + BS, no rebound or guarding.   Neurologic Cranial Nerve exam:- CN III-XII intact(No nystagmus), symmetric smile. Strength:- 5/5 equal and symmetric strength both upper and lower extremities.      Assessment & Plan:   Patient Instructions  Hypertension with very high blood pressure daily.  But no cardiac or neurologic signs and symptoms.  Good good/normal neurologic exam.  He had not been on blood pressure medication for more than 24 hours.  Please take your BP medication immediately when you get your car.  Then resume blood pressure medication daily tomorrow morning.  I recommend that you get electronic blood pressure cuff over-the-counter and check blood pressures daily.  Document those readings.  If you have any cardiac or neurologic signs and symptoms then recommend ED evaluation.  Diabetes with most recent A1c well controlled.  Continue metformin and will get metabolic panel and I0X today.  Hyperlipidemia.  Continue atorvastatin.  Last lipid panel showed elevated lipid levels.  We will repeat lipid  panel today.  On review of prior labs low TSH.  We will get T4 and TSH today.  Might refer to endocrinologist as labs are normal.  Follow-up this coming Monday 3 PM.  We will plan to do wellness exam at that time and try to update your health maintenance.

## 2021-04-24 LAB — COMPREHENSIVE METABOLIC PANEL
ALT: 9 U/L (ref 0–35)
AST: 10 U/L (ref 0–37)
Albumin: 4.5 g/dL (ref 3.5–5.2)
Alkaline Phosphatase: 60 U/L (ref 39–117)
BUN: 15 mg/dL (ref 6–23)
CO2: 29 mEq/L (ref 19–32)
Calcium: 9.4 mg/dL (ref 8.4–10.5)
Chloride: 105 mEq/L (ref 96–112)
Creatinine, Ser: 0.78 mg/dL (ref 0.40–1.20)
GFR: 83.97 mL/min (ref >60.00)
Glucose, Bld: 87 mg/dL (ref 70–99)
Potassium: 4.1 mEq/L (ref 3.5–5.1)
Sodium: 140 mEq/L (ref 135–145)
Total Bilirubin: 0.4 mg/dL (ref 0.2–1.2)
Total Protein: 7.1 g/dL (ref 6.0–8.3)

## 2021-04-24 LAB — T4, FREE: Free T4: 0.89 ng/dL (ref 0.60–1.60)

## 2021-04-24 LAB — LIPID PANEL
Cholesterol: 235 mg/dL — ABNORMAL HIGH (ref 0–200)
HDL: 39.1 mg/dL (ref >39.00)
LDL Cholesterol: 168 mg/dL — ABNORMAL HIGH (ref 0–99)
NonHDL: 195.42
Total CHOL/HDL Ratio: 6
Triglycerides: 137 mg/dL (ref 0.0–149.0)
VLDL: 27.4 mg/dL (ref 0.0–40.0)

## 2021-04-24 LAB — HEMOGLOBIN A1C: Hgb A1c MFr Bld: 6.3 % (ref 4.6–6.5)

## 2021-04-24 LAB — TSH: TSH: 0.62 u[IU]/mL (ref 0.35–5.50)

## 2021-04-25 ENCOUNTER — Other Ambulatory Visit: Payer: Self-pay

## 2021-04-25 DIAGNOSIS — M62830 Muscle spasm of back: Secondary | ICD-10-CM

## 2021-04-25 DIAGNOSIS — E119 Type 2 diabetes mellitus without complications: Secondary | ICD-10-CM

## 2021-04-25 DIAGNOSIS — E782 Mixed hyperlipidemia: Secondary | ICD-10-CM

## 2021-04-25 DIAGNOSIS — I10 Essential (primary) hypertension: Secondary | ICD-10-CM

## 2021-04-25 MED ORDER — ATORVASTATIN CALCIUM 40 MG PO TABS: 40.0000 mg | ORAL_TABLET | Freq: Every day | ORAL | 0 refills | Status: DC

## 2021-04-25 MED ORDER — METFORMIN HCL 500 MG PO TABS: 500.0000 mg | ORAL_TABLET | Freq: Every day | ORAL | 0 refills | Status: DC

## 2021-04-25 MED ORDER — LISINOPRIL-HYDROCHLOROTHIAZIDE 20-25 MG PO TABS: 1.0000 | ORAL_TABLET | Freq: Every day | ORAL | 0 refills | Status: DC

## 2021-04-25 MED ORDER — METHOCARBAMOL 750 MG PO TABS: ORAL_TABLET | ORAL | 1 refills | Status: DC

## 2021-04-28 ENCOUNTER — Other Ambulatory Visit: Payer: Self-pay

## 2021-04-28 ENCOUNTER — Encounter: Payer: Self-pay | Admitting: Medical

## 2021-04-28 ENCOUNTER — Telehealth: Payer: Self-pay | Admitting: Medical

## 2021-04-28 ENCOUNTER — Ambulatory Visit (INDEPENDENT_AMBULATORY_CARE_PROVIDER_SITE_OTHER): Payer: Commercial Managed Care - PPO | Admitting: Medical

## 2021-04-28 VITALS — BP 130/80 | HR 100 | Temp 98.2°F | Ht 60.0 in | Wt 182.4 lb

## 2021-04-28 DIAGNOSIS — I10 Essential (primary) hypertension: Secondary | ICD-10-CM | POA: Diagnosis not present

## 2021-04-28 DIAGNOSIS — M25512 Pain in left shoulder: Secondary | ICD-10-CM

## 2021-04-28 DIAGNOSIS — M546 Pain in thoracic spine: Secondary | ICD-10-CM | POA: Diagnosis not present

## 2021-04-28 DIAGNOSIS — M25511 Pain in right shoulder: Secondary | ICD-10-CM | POA: Diagnosis not present

## 2021-04-28 NOTE — Telephone Encounter (Signed)
Opened to review  Babbie Dondlinger, PA-C  

## 2021-04-28 NOTE — Patient Instructions (Addendum)
Blood pressure/htn much better controlled now that you are back on medication. Continue zestoretic daily.  Shoulder pain and back pain. Can use meloxicam but if you do use recommend check bp prior to use. Make sure bp less than 140/90.  For back cramps or spasm can use robaxin. Rx advisement given.  Follow up in 3 months or sooner if needed.

## 2021-04-28 NOTE — Telephone Encounter (Signed)
Would you ask pt to follow up in 3 months and will repeat lipid panel fasting.

## 2021-04-28 NOTE — Progress Notes (Signed)
Subjective:    Patient ID: Janice Jacobs, female    DOB: [birth year], 60 y.o.   MRN: [id number]  HPI  Pt in for follow up.(Pt needed to get back to work so decided not to do full wellness exam)  Pt bp today 130/80. Bp was high last time but she had skipped bp med that morning.   Pt has knee, shoulder pain and back pain. She states meloxicam every other night..  For back spasms at nigh can continue robaxin if needed. Describes at time thoracic pain/cramping.  Flu vaccine today.    Review of Systems  Constitutional:  Negative for chills, fatigue and fever.  HENT:  Negative for congestion, drooling and ear pain.   Respiratory:  Negative for cough, chest tightness, shortness of breath and wheezing.   Cardiovascular:  Negative for chest pain.  Gastrointestinal:  Negative for abdominal pain, constipation, diarrhea and nausea.  Genitourinary:  Negative for dysuria, frequency and hematuria.  Musculoskeletal:  Negative for back pain.       See hpi.  Neurological:  Negative for dizziness, speech difficulty, weakness, numbness and headaches.  Hematological:  Negative for adenopathy. Does not bruise/bleed easily.  Psychiatric/Behavioral:  Negative for behavioral problems, decreased concentration, dysphoric mood and hallucinations. The patient is not nervous/anxious.     Past Medical History:  Diagnosis Date   Arthritis    Blood transfusion without reported diagnosis    Family hx-breast malignancy    Hyperlipidemia    Hypertension    Scoliosis of lumbar region due to degenerative disease of spine in adult    Smoking 1/2 pack a day or less 04/12/2019   Vitamin D deficiency 05/10/2019     Social History   Socioeconomic History   Marital status: Divorced    Spouse name: Not on file   Number of children: Not on file   Years of education: Not on file   Highest education level: Not on file  Occupational History   Not on file  Tobacco Use   Smoking status: Every Day    Packs/day:  0.50    Types: Cigarettes   Smokeless tobacco: Never  Vaping Use   Vaping Use: Never used  Substance and Sexual Activity   Alcohol use: No   Drug use: No   Sexual activity: Never  Other Topics Concern   Not on file  Social History Narrative   Social Documentation   Social Determinants of Health   Financial Resource Strain: Not on file  Food Insecurity: Not on file  Transportation Needs: Not on file  Physical Activity: Not on file  Stress: Not on file  Social Connections: Not on file  Intimate Partner Violence: Not on file    Past Surgical History:  Procedure Laterality Date   ABDOMINAL HYSTERECTOMY      Family History  Problem Relation Age of Onset   Colon cancer Neg Hx    Colon polyps Neg Hx    Esophageal cancer Neg Hx    Rectal cancer Neg Hx    Stomach cancer Neg Hx     No Known Allergies  Current Outpatient Medications on File Prior to Visit  Medication Sig Dispense Refill   Accu-Chek Softclix Lancets lancets daily.     atorvastatin (LIPITOR) 40 MG tablet Take 1 tablet (40 mg total) by mouth daily. 120 tablet 0   Blood Glucose Monitoring Suppl (ACCU-CHEK GUIDE ME) w/Device KIT See admin instructions.     glucose blood (ACCU-CHEK GUIDE) test strip TEST ONCE DAILY  100 strip 3   lisinopril-hydrochlorothiazide (ZESTORETIC) 20-25 MG tablet Take 1 tablet by mouth daily. 90 tablet 0   meloxicam (MOBIC) 15 MG tablet Take 1 tablet (15 mg total) by mouth daily. 30 tablet 0   metFORMIN (GLUCOPHAGE) 500 MG tablet Take 1 tablet (500 mg total) by mouth daily with breakfast. 90 tablet 0   methocarbamol (ROBAXIN) 750 MG tablet TAKE 1 TABLET(750 MG) BY MOUTH Q8H AS NEEDED FOR MUSCLE SPASMS 30 tablet 1   No current facility-administered medications on file prior to visit.    BP 130/80   Pulse 100   Temp 98.2 F (36.8 C)   Ht 5' (1.524 m)   Wt 182 lb 6.4 oz (82.7 kg)   SpO2 100%   BMI 35.62 kg/m       Objective:   Physical Exam  General Mental Status- Alert.  General Appearance- Not in acute distress.   Skin General: Color- Normal Color. Moisture- Normal Moisture.  Neck Carotid Arteries- Normal color. Moisture- Normal Moisture. No carotid bruits. No JVD.  Chest and Lung Exam Auscultation: Breath Sounds:-Normal.  Cardiovascular Auscultation:Rythm- Regular. Murmurs & Other Heart Sounds:Auscultation of the heart reveals- No Murmurs.  Abdomen Inspection:-Inspeection Normal. Palpation/Percussion:Note:No mass. Palpation and Percussion of the abdomen reveal- Non Tender, Non Distended + BS, no rebound or guarding.   Neurologic Cranial Nerve exam:- CN III-XII intact(No nystagmus), symmetric smile. Strength:- 5/5 equal and symmetric strength both upper and lower extremities.       Assessment & Plan:   Patient Instructions  Blood pressure/htn much better controlled now that you are back on medication. Continue zestoretic daily.  Shoulder pain and back pain. Can use meloxicam but if you do use recommend check bp prior to use. Make sure bp less than 140/90.  For back cramps or spasm can use robaxin. Rx advisement given.  Follow up in 3 months or sooner if needed.   Mackie Pai, PA-C

## 2021-04-29 NOTE — Telephone Encounter (Signed)
Appt made

## 2021-04-30 ENCOUNTER — Other Ambulatory Visit: Payer: Self-pay | Admitting: Family

## 2021-04-30 DIAGNOSIS — M4156 Other secondary scoliosis, lumbar region: Secondary | ICD-10-CM

## 2021-06-09 ENCOUNTER — Other Ambulatory Visit: Payer: Self-pay | Admitting: Medical

## 2021-06-09 DIAGNOSIS — M62830 Muscle spasm of back: Secondary | ICD-10-CM

## 2021-06-25 ENCOUNTER — Encounter: Payer: Self-pay | Admitting: Medical

## 2021-06-25 ENCOUNTER — Ambulatory Visit: Payer: No Typology Code available for payment source | Admitting: Medical

## 2021-06-25 ENCOUNTER — Ambulatory Visit (HOSPITAL_BASED_OUTPATIENT_CLINIC_OR_DEPARTMENT_OTHER)
Admission: RE | Admit: 2021-06-25 | Discharge: 2021-06-25 | Disposition: A | Discharge status: Home / Self Care | Payer: No Typology Code available for payment source | Source: Ambulatory Visit | Attending: Medical | Admitting: Medical

## 2021-06-25 ENCOUNTER — Other Ambulatory Visit: Payer: Self-pay

## 2021-06-25 VITALS — BP 150/80 | HR 89 | Temp 98.2°F | Resp 18 | Ht 61.0 in | Wt 183.4 lb

## 2021-06-25 DIAGNOSIS — R052 Subacute cough: Secondary | ICD-10-CM | POA: Insufficient documentation

## 2021-06-25 DIAGNOSIS — R509 Fever, unspecified: Secondary | ICD-10-CM

## 2021-06-25 DIAGNOSIS — R739 Hyperglycemia, unspecified: Secondary | ICD-10-CM

## 2021-06-25 DIAGNOSIS — I10 Essential (primary) hypertension: Secondary | ICD-10-CM | POA: Diagnosis not present

## 2021-06-25 DIAGNOSIS — R0981 Nasal congestion: Secondary | ICD-10-CM

## 2021-06-25 LAB — CBC WITH DIFFERENTIAL/PLATELET
Basophils Absolute: 0.1 10*3/uL (ref 0.0–0.1)
Basophils Relative: 0.7 % (ref 0.0–3.0)
Eosinophils Absolute: 0.4 10*3/uL (ref 0.0–0.7)
Eosinophils Relative: 4.4 % (ref 0.0–5.0)
HCT: 41.9 % (ref 36.0–46.0)
Hemoglobin: 13.7 g/dL (ref 12.0–15.0)
Lymphocytes Relative: 29.9 % (ref 12.0–46.0)
Lymphs Abs: 2.5 10*3/uL (ref 0.7–4.0)
MCHC: 32.6 g/dL (ref 30.0–36.0)
MCV: 93.6 fl (ref 78.0–100.0)
Monocytes Absolute: 0.6 10*3/uL (ref 0.1–1.0)
Monocytes Relative: 6.7 % (ref 3.0–12.0)
Neutro Abs: 4.8 10*3/uL (ref 1.4–7.7)
Neutrophils Relative %: 58.3 % (ref 43.0–77.0)
Platelets: 262 10*3/uL (ref 150.0–400.0)
RBC: 4.47 Mil/uL (ref 3.87–5.11)
RDW: 15 % (ref 11.5–15.5)
WBC: 8.3 10*3/uL (ref 4.0–10.5)

## 2021-06-25 MED ORDER — AZITHROMYCIN 250 MG PO TABS: ORAL_TABLET | ORAL | 0 refills | Status: AC

## 2021-06-25 MED ORDER — FLUTICASONE PROPIONATE 50 MCG/ACT NA SUSP: 2.0000 | Freq: Every day | NASAL | 1 refills | Status: DC

## 2021-06-25 MED ORDER — BENZONATATE 100 MG PO CAPS: 100.0000 mg | ORAL_CAPSULE | Freq: Three times a day (TID) | ORAL | 0 refills | Status: DC | PRN

## 2021-06-25 NOTE — Patient Instructions (Addendum)
One week of intially viral syndrome/uri type symptoms but new onset fever x 1 day and productive cough.  Will get cbc and cxr. Evaluate bronchitis vs pneumonia. One week into illness I don't think at this point beneficial to test for covid or flu.  Rx benzonatate cough tabs, flonase for nasal congestion and azithromycin antibiotic.  Htn- you bp initially elevated.  No sudafed used. Avoid daily use of nsaids as they can increase bp(you recently used meloxicam last night)  Your bp came down to 150/80. I want you to check bp every other day for one week and notify me with readings. If bp over 140/90 you may need to be on lisinopril 40 mg daily and hctz 25 mg daily. Want you to check while not on meloxicam. Avoid use of meloxicam.  Prediabetic range a1c. Continue metformin 500 mg twice daily. If your average gets close to 140 or higher will likely recommend going to twice a day.  Follow up date to be determined after lab  and imaging review.

## 2021-06-25 NOTE — Progress Notes (Signed)
Subjective:    Patient ID: Janice Jacobs, female    DOB: 15-Dec-1960, 61 y.o.   MRN: [id number]  HPI  Pt in for recent cough for about one week. Onset of cough with nasal congestion, cough, running nose and chills. Pt had no bodyaches.   Pt has not been evaluated. When asked if she tested for covid states adamantly not covid. She states had covid x2. Last time had was about 2 months ago. She has tested.  Pt had 3 covid vaccines  Htn- pt is on zestoretic.    Review of Systems  Constitutional:  Positive for fever. Negative for chills.       101 this morning.  HENT:  Positive for congestion. Negative for ear pain and facial swelling.   Eyes:  Negative for photophobia and pain.  Respiratory:  Positive for cough. Negative for chest tightness, shortness of breath and wheezing.        Productive cough.  Cardiovascular:  Negative for chest pain and palpitations.  Gastrointestinal:  Negative for abdominal pain, constipation, nausea, rectal pain and vomiting.  Genitourinary:  Negative for frequency and hematuria.  Musculoskeletal:  Negative for back pain and myalgias.    Past Medical History:  Diagnosis Date   Arthritis    Blood transfusion without reported diagnosis    Family hx-breast malignancy    Hyperlipidemia    Hypertension    Scoliosis of lumbar region due to degenerative disease of spine in adult    Smoking 1/2 pack a day or less 04/12/2019   Vitamin D deficiency 05/10/2019     Social History   Socioeconomic History   Marital status: Divorced    Spouse name: Not on file   Number of children: Not on file   Years of education: Not on file   Highest education level: Not on file  Occupational History   Not on file  Tobacco Use   Smoking status: Every Day    Packs/day: 0.50    Types: Cigarettes   Smokeless tobacco: Never  Vaping Use   Vaping Use: Never used  Substance and Sexual Activity   Alcohol use: No   Drug use: No   Sexual activity: Never  Other Topics  Concern   Not on file  Social History Narrative   Social Documentation   Social Determinants of Health   Financial Resource Strain: Not on file  Food Insecurity: Not on file  Transportation Needs: Not on file  Physical Activity: Not on file  Stress: Not on file  Social Connections: Not on file  Intimate Partner Violence: Not on file    Past Surgical History:  Procedure Laterality Date   ABDOMINAL HYSTERECTOMY      Family History  Problem Relation Age of Onset   Colon cancer Neg Hx    Colon polyps Neg Hx    Esophageal cancer Neg Hx    Rectal cancer Neg Hx    Stomach cancer Neg Hx     No Known Allergies  Current Outpatient Medications on File Prior to Visit  Medication Sig Dispense Refill   Accu-Chek Softclix Lancets lancets daily.     atorvastatin (LIPITOR) 40 MG tablet Take 1 tablet (40 mg total) by mouth daily. 120 tablet 0   Blood Glucose Monitoring Suppl (ACCU-CHEK GUIDE ME) w/Device KIT See admin instructions.     glucose blood (ACCU-CHEK GUIDE) test strip TEST ONCE DAILY 100 strip 3   lisinopril-hydrochlorothiazide (ZESTORETIC) 20-25 MG tablet Take 1 tablet by mouth daily. [place]  tablet 0   metFORMIN (GLUCOPHAGE) 500 MG tablet Take 1 tablet (500 mg total) by mouth daily with breakfast. 90 tablet 0   methocarbamol (ROBAXIN) 750 MG tablet TAKE 1 TABLET(750 MG) BY MOUTH EVERY 8 HOURS AS NEEDED FOR MUSCLE SPASMS 30 tablet 1   No current facility-administered medications on file prior to visit.    BP (!) 160/90    Pulse 89    Temp 98.2 F (36.8 C)    Resp 18    Ht 5' 1"  (1.549 m)    Wt 183 lb 6.4 oz (83.2 kg)    SpO2 95%    BMI 34.65 kg/m       Objective:   Physical Exam  General- No acute distress. Pleasant patient. Neck- Full range of motion, no jvd Lungs- Clear, even and unlabored. Heart- regular rate and rhythm. Neurologic- CNII- XII grossly intact.  Heent- no sinus pressure on palpation. Tms are normal. No tonsil hypertrophy.        Assessment & Plan:    Patient Instructions  One week of intially viral syndrome/uri type symptoms but new onset fever x 1 day and productive cough.  Will get cbc and cxr. Evaluate bronchitis vs pneumonia. One week into illness I don't think at this point beneficial to test for covid or flu.  Rx benzonatate cough tabs, flonase for nasal congestion and azithromycin antibiotic.  Htn- you bp initially elevated.  No sudafed used. Avoid daily use of nsaids as they can increase bp(you recently used melicam)  Your bp came down to 150/80. I want you to check bp every other day for one week and notify me with readings. You may need to be on lisinopril 40 mg daily and hctz 25 mg daily. Want you to check while not on meloxicam. Avoid use of melixicam.  Prediabetic range a1c. Continue metformin 500 mg twice daily. If your average gets close to 140 or higher will likely recommend going to twice a day.  Follow up date to be determined after lab review.    Mackie Pai, PA-C

## 2021-07-04 ENCOUNTER — Other Ambulatory Visit: Payer: Self-pay

## 2021-07-04 DIAGNOSIS — E119 Type 2 diabetes mellitus without complications: Secondary | ICD-10-CM

## 2021-07-04 NOTE — Progress Notes (Signed)
Pt no longer pt of Durene Fruits, NP

## 2021-07-13 ENCOUNTER — Other Ambulatory Visit: Payer: Self-pay | Admitting: Medical

## 2021-07-13 DIAGNOSIS — M62830 Muscle spasm of back: Secondary | ICD-10-CM

## 2021-08-06 ENCOUNTER — Telehealth: Payer: Self-pay | Admitting: Medical

## 2021-08-06 DIAGNOSIS — I10 Essential (primary) hypertension: Secondary | ICD-10-CM

## 2021-08-06 MED ORDER — LISINOPRIL-HYDROCHLOROTHIAZIDE 20-25 MG PO TABS: 1.0000 | ORAL_TABLET | Freq: Every day | ORAL | 0 refills | Status: DC

## 2021-08-06 NOTE — Telephone Encounter (Signed)
Rx sent 

## 2021-08-06 NOTE — Telephone Encounter (Signed)
Medication:  lisinopril-hydrochlorothiazide (ZESTORETIC) 20-25 MG tablet    Has the patient contacted their pharmacy? No. (If no, request that the patient contact the pharmacy for the refill.) (If yes, when and what did the pharmacy advise?)     Preferred Pharmacy (with phone number or street name):  [facility], [place] - [address]  [place], [place] [address]  Phone:  [phone]  Fax:  [phone]     Agent: Please be advised that RX refills may take up to 3 business days. We ask that you follow-up with your pharmacy.  Pt states she need sig switched to 2 tablets by mouth daily.

## 2021-08-12 ENCOUNTER — Telehealth: Payer: Self-pay | Admitting: Medical

## 2021-08-12 ENCOUNTER — Other Ambulatory Visit: Payer: Self-pay | Admitting: Medical

## 2021-08-12 DIAGNOSIS — E119 Type 2 diabetes mellitus without complications: Secondary | ICD-10-CM

## 2021-08-12 NOTE — Telephone Encounter (Signed)
Medication:  metFORMIN (GLUCOPHAGE) 500 MG tablet [[id number]]     Has the patient contacted their pharmacy? No. (If no, request that the patient contact the pharmacy for the refill.) (If yes, when and what did the pharmacy advise?)     Preferred Pharmacy (with phone number or street name):  [facility], [place] - [address]  [place], [place] [address]  Phone:  [phone]  Fax:  [phone]     Agent: Please be advised that RX refills may take up to 3 business days. We ask that you follow-up with your pharmacy.

## 2021-08-12 NOTE — Telephone Encounter (Signed)
Rx sent 

## 2021-08-21 ENCOUNTER — Other Ambulatory Visit: Payer: Self-pay | Admitting: Medical

## 2021-08-21 DIAGNOSIS — I10 Essential (primary) hypertension: Secondary | ICD-10-CM

## 2021-08-21 MED ORDER — LISINOPRIL-HYDROCHLOROTHIAZIDE 20-25 MG PO TABS: 1.0000 | ORAL_TABLET | Freq: Every day | ORAL | 1 refills | Status: DC

## 2021-08-21 NOTE — Telephone Encounter (Signed)
Pt states her and Percell Miller discussed to take meds 2 times daily. Sig on prescription states 1  ? ?Pt is unable to pick up prescription due to insurance. Pt put pharmacist on the line who advised me to have Percell Miller switch the sig and pt will pick up tomorrow  ? ?Please advise   ?

## 2021-08-22 MED ORDER — LISINOPRIL 40 MG PO TABS: 40.0000 mg | ORAL_TABLET | Freq: Every day | ORAL | 0 refills | Status: DC

## 2021-08-22 MED ORDER — HYDROCHLOROTHIAZIDE 25 MG PO TABS: 25.0000 mg | ORAL_TABLET | Freq: Every day | ORAL | 0 refills | Status: DC

## 2021-08-22 NOTE — Addendum Note (Signed)
Addended by: Anabel Halon on: 08/22/2021 04:49 PM   Modules accepted: Orders

## 2021-08-22 NOTE — Telephone Encounter (Signed)
Can you change the sig ?

## 2021-08-25 NOTE — Telephone Encounter (Signed)
Pt called and lvm to return call 

## 2021-08-26 NOTE — Telephone Encounter (Signed)
Pt called and lvm to return call 

## 2021-08-29 ENCOUNTER — Telehealth (INDEPENDENT_AMBULATORY_CARE_PROVIDER_SITE_OTHER): Payer: No Typology Code available for payment source | Admitting: Medical

## 2021-08-29 DIAGNOSIS — I10 Essential (primary) hypertension: Secondary | ICD-10-CM | POA: Diagnosis not present

## 2021-08-29 DIAGNOSIS — J40 Bronchitis, not specified as acute or chronic: Secondary | ICD-10-CM

## 2021-08-29 MED ORDER — AZITHROMYCIN 250 MG PO TABS: ORAL_TABLET | ORAL | 0 refills | Status: AC

## 2021-08-29 MED ORDER — ALBUTEROL SULFATE HFA 108 (90 BASE) MCG/ACT IN AERS
2.0000 | INHALATION_SPRAY | Freq: Four times a day (QID) | RESPIRATORY_TRACT | 0 refills | Status: DC | PRN
Start: 2021-08-29 — End: 2021-10-01

## 2021-08-29 MED ORDER — FLUTICASONE PROPIONATE 50 MCG/ACT NA SUSP: 2.0000 | Freq: Every day | NASAL | 1 refills | Status: AC

## 2021-08-29 MED ORDER — HYDROCHLOROTHIAZIDE 25 MG PO TABS
25.0000 mg | ORAL_TABLET | Freq: Every day | ORAL | 2 refills | Status: DC
Start: 2021-08-29 — End: 2021-12-15

## 2021-08-29 MED ORDER — HYDROCODONE BIT-HOMATROP MBR 5-1.5 MG/5ML PO SOLN: 5.0000 mL | Freq: Three times a day (TID) | ORAL | 0 refills | Status: DC | PRN

## 2021-08-29 NOTE — Progress Notes (Signed)
? ?Subjective:  ? ? Patient ID: Janice Jacobs, female    DOB: 1961-03-26, 61 y.o.   MRN: [id number] ? ?HPI ?Virtual Visit via Telephone Note ? ?I connected with Janice Jacobs on 08/29/21 at  3:40 PM EDT by telephone and verified that I am speaking with the correct person using two identifiers. ? ?Location: ?Patient: HOME ?Provider: OFFICE ?  ?I discussed the limitations, risks, security and privacy concerns of performing an evaluation and management service by telephone and the availability of in person appointments. I also discussed with the patient that there may be a patient responsible charge related to this service. The patient expressed understanding and agreed to proceed. ? ? ?Video visit failed. ? ?History of Present Illness: ?Pt states had nasal congestion, productive cough, runny nose and now having chest congestion. Pt tried corciden, vics 44 and tried benzonatate. Benzonatate did not help much. ? ?Pt is smoker. She has some wheezing. Pt does not have 02 sat monitor. ? ?She had symptoms for 4 days. She tested for covid and got negative result. ? ?Htn- pt should be on on lisinopril 40 mg daily and hctz 25 mg q day. Last bp check 2 days ago was 948 systolic. ?  ?Observations/Objective: ?General- no acute distress. Pleasant. ? ?Assessment and Plan: ?Initially uri symptoms but now more bronchitis like.  Not reporting productive cough over the last couple of days and some intermittent wheezing.  Patient is a smoker.  Tested for COVID and came back negative. ? ?Prescribing Flonase for nasal congestion, azithromycin antibiotic and prescribe Hycodan for severe cough.  Rx advisement given regarding Hycodan use. ? ?If productive cough persisting into next week recommend chest x-ray.  Also if not getting better with above treatment into next week explained office visit will be beneficial. ? ?Hypertension-blood pressure recently 546E systolic 2 days ago.  Patient could not remember diastolic.  For instructions on last  office visit plan was to continue lisinopril 40 mg and add on HCTZ 25 mg daily.  Patient indicates never got the HCTZ prescription.  I resent that in today and asking her to follow-up in 2 weeks for blood pressure check and metabolic panel. ? ?Mackie Pai, PA-C  ? ?Time spent with patient today was  27 minutes which consisted of chart revdiew, discussing diagnosis, work up treatment and documentation.  ? ?Follow Up Instructions: ? ?  ?I discussed the assessment and treatment plan with the patient. The patient was provided an opportunity to ask questions and all were answered. The patient agreed with the plan and demonstrated an understanding of the instructions. ?  ?The patient was advised to call back or seek an in-person evaluation if the symptoms worsen or if the condition fails to improve as anticipated. ? ? ? ? ?Mackie Pai, PA-C  ? ? ?Review of Systems ? ?   ?Objective:  ? Physical Exam ? ? ? ? ?   ?Assessment & Plan:  ? ?Patient Instructions  ?Initially uri symptoms but now more bronchitis like.  Not reporting productive cough over the last couple of days and some intermittent wheezing.  Patient is a smoker.  Tested for COVID and came back negative. ? ?Prescribing Flonase for nasal congestion, azithromycin antibiotic and prescribe Hycodan for severe cough.  Rx advisement given regarding Hycodan use. ? ?If productive cough persisting into next week recommend chest x-ray.  Also if not getting better with above treatment into next week explained office visit will be beneficial. ? ?Hypertension-blood pressure recently 140s  systolic 2 days ago.  Patient could not remember diastolic.  For instructions on last office visit plan was to continue lisinopril 40 mg and add on HCTZ 25 mg daily.  Patient indicates never got the HCTZ prescription.  I resent that in today and asking her to follow-up in 2 weeks for blood pressure check and metabolic panel.  ?

## 2021-08-29 NOTE — Patient Instructions (Signed)
Initially uri symptoms but now more bronchitis like.  Not reporting productive cough over the last couple of days and some intermittent wheezing.  Patient is a smoker.  Tested for COVID and came back negative. ? ?Prescribing Flonase for nasal congestion, azithromycin antibiotic and prescribe Hycodan for severe cough.  Rx advisement given regarding Hycodan use. ? ?If productive cough persisting into next week recommend chest x-ray.  Also if not getting better with above treatment into next week explained office visit will be beneficial. ? ?Hypertension-blood pressure recently 129W systolic 2 days ago.  Patient could not remember diastolic.  For instructions on last office visit plan was to continue lisinopril 40 mg and add on HCTZ 25 mg daily.  Patient indicates never got the HCTZ prescription.  I resent that in today and asking her to follow-up in 2 weeks for blood pressure check and metabolic panel. ?

## 2021-09-01 ENCOUNTER — Telehealth: Payer: Self-pay

## 2021-09-01 NOTE — Telephone Encounter (Signed)
Nurse Assessment ?Nurse: Mariel Sleet RN, Erasmo Downer Date/Time (Eastern Time): 08/30/2021 11:03:15 AM ?Confirm and document reason for call. If ?symptomatic, describe symptoms. ?---Caller states that she has a cough and they called in ?a script for cough medication. ?Does the patient have any new or worsening ?symptoms? ---Yes ?Will a triage be completed? ---Yes ?Related visit to physician within the last 2 weeks? ---Yes ?Does the PT have any chronic conditions? (i.e. ?diabetes, asthma, this includes High risk factors for ?pregnancy, etc.) ?---Unknown ?Is this a behavioral health or substance abuse call? ---No ?Nurse: Mariel Sleet, RN, Erasmo Downer Date/Time (Eastern Time): 08/30/2021 11:02:31 AM ?Please select the assessment type ---Request for controlled medication refill ?Additional Documentation ---MD called in script for cough medication that is a ?narcotic, pharmacy does not have it. ?Is there an on-call physician for the client? ---Yes ?Do the client directives specifically allow for paging ?the on-call regarding scheduled drugs? ---No ?PLEASE NOTE: All timestamps contained within this report are represented as [place] Standard Time. ?CONFIDENTIALTY NOTICE: This fax transmission is intended only for the addressee. It contains information that is legally privileged, confidential or ?otherwise protected from use or disclosure. If you are not the intended recipient, you are strictly prohibited from reviewing, disclosing, copying using ?or disseminating any of this information or taking any action in reliance on or regarding this information. If you have received this fax in error, please ?notify us immediately by telephone so that we can arrange for its return to [place]. Phone: [phone], Toll-Free: [phone], Fax: [phone] ?Page: 2 of 2 ?Call Id: [id number] ?Guidelines ?Guideline Title Affirmed Question Affirmed Notes Nurse Date/Time (Eastern ?Time) ?Cough - Acute NonProductive Wheezing is present Donadeo,  Therapist, sports, ?Erasmo Downer ?08/30/2021 11:04:27 ?AM ?Disp. Time (Eastern ?Time) Disposition Final User ?08/30/2021 11:05:36 AM See HCP within 4 Hours (or ?PCP triage) ?Yes Donadeo, RN, Erasmo Downer ?Caller Disagree/Comply Comply ?Caller Understands Yes ?PreDisposition Call Doctor ?Care Advice Given Per Guideline ?CARE ADVICE given per Cough - Acute Non-Productive (Adult) guideline. * You become worse CALL BACK IF: SEE HCP (OR ?PCP TRIAGE) WITHIN 4 HOURS: * IF OFFICE WILL BE OPEN: You need to be seen within the next 3 or 4 hours. Call your ?doctor (or NP/PA) now or as soon as the office opens. ?Referrals ?GO TO FACILITY UNDECIDED ?

## 2021-09-17 ENCOUNTER — Ambulatory Visit (INDEPENDENT_AMBULATORY_CARE_PROVIDER_SITE_OTHER): Payer: No Typology Code available for payment source | Admitting: Medical

## 2021-09-17 VITALS — BP 124/86 | HR 83 | Temp 98.0°F | Resp 18 | Ht 61.0 in | Wt 176.2 lb

## 2021-09-17 DIAGNOSIS — Z1231 Encounter for screening mammogram for malignant neoplasm of breast: Secondary | ICD-10-CM

## 2021-09-17 DIAGNOSIS — I10 Essential (primary) hypertension: Secondary | ICD-10-CM

## 2021-09-17 DIAGNOSIS — E785 Hyperlipidemia, unspecified: Secondary | ICD-10-CM | POA: Diagnosis not present

## 2021-09-17 DIAGNOSIS — Z124 Encounter for screening for malignant neoplasm of cervix: Secondary | ICD-10-CM

## 2021-09-17 DIAGNOSIS — F17200 Nicotine dependence, unspecified, uncomplicated: Secondary | ICD-10-CM

## 2021-09-17 DIAGNOSIS — E119 Type 2 diabetes mellitus without complications: Secondary | ICD-10-CM

## 2021-09-17 DIAGNOSIS — E782 Mixed hyperlipidemia: Secondary | ICD-10-CM

## 2021-09-17 LAB — COMPREHENSIVE METABOLIC PANEL
ALT: 9 U/L (ref 0–35)
AST: 12 U/L (ref 0–37)
Albumin: 4.5 g/dL (ref 3.5–5.2)
Alkaline Phosphatase: 67 U/L (ref 39–117)
BUN: 25 mg/dL — ABNORMAL HIGH (ref 6–23)
CO2: 27 mEq/L (ref 19–32)
Calcium: 9.8 mg/dL (ref 8.4–10.5)
Chloride: 103 mEq/L (ref 96–112)
Creatinine, Ser: 0.98 mg/dL (ref 0.40–1.20)
GFR: 63.67 mL/min (ref >60.00)
Glucose, Bld: 112 mg/dL — ABNORMAL HIGH (ref 70–99)
Potassium: 3.9 mEq/L (ref 3.5–5.1)
Sodium: 138 mEq/L (ref 135–145)
Total Bilirubin: 0.3 mg/dL (ref 0.2–1.2)
Total Protein: 7.2 g/dL (ref 6.0–8.3)

## 2021-09-17 LAB — LIPID PANEL
Cholesterol: 249 mg/dL — ABNORMAL HIGH (ref 0–200)
HDL: 39.3 mg/dL (ref >39.00)
LDL Cholesterol: 182 mg/dL — ABNORMAL HIGH (ref 0–99)
NonHDL: 209.72
Total CHOL/HDL Ratio: 6
Triglycerides: 139 mg/dL (ref 0.0–149.0)
VLDL: 27.8 mg/dL (ref 0.0–40.0)

## 2021-09-17 LAB — HEMOGLOBIN A1C: Hgb A1c MFr Bld: 6.4 % (ref 4.6–6.5)

## 2021-09-17 MED ORDER — ATORVASTATIN CALCIUM 40 MG PO TABS: 40.0000 mg | ORAL_TABLET | Freq: Every day | ORAL | 3 refills | Status: DC

## 2021-09-17 NOTE — Progress Notes (Signed)
? ?Subjective:  ? ? Patient ID: Janice Jacobs, female    DOB: Feb 12, 1961, 61 y.o.   MRN: [id number] ? ?HPI ? ?Pt in for follow up. ? ?Htn- pt bp is well controlled. Lisinopril 40 mg daily and hctz 25 mg daily. BP better today. ? ? ?Smoker- 1/2 pack a day since 61 years old. The 10-year ASCVD risk score (Arnett DK, et al., 2019) is: 33.1% ?  Values used to calculate the score: ?    Age: 39 years ?    Sex: Female ?    Is Non-Hispanic African American: Yes ?    Diabetic: Yes ?    Tobacco smoker: Yes ?    Systolic Blood Pressure: 419 mmHg ?    Is BP treated: Yes ?    HDL Cholesterol: 39.1 mg/dL ?    Total Cholesterol: 235 mg/dL  ? ?Diabetes- last a1c in novermber was 6.3. on low dose metformin 500 mg daily. ? ?High cholesterol- on atorvastatin 40 mg daily. ? ? ?Post covid infection now since 08-29-2021. Pt states feel fine now. ? ?Review of Systems  ?Constitutional:  Negative for chills and fever.  ?HENT:  Negative for congestion, drooling, ear discharge, facial swelling, mouth sores and postnasal drip.   ?Respiratory:  Negative for cough, chest tightness, shortness of breath and wheezing.   ?Cardiovascular:  Negative for chest pain and palpitations.  ?Gastrointestinal:  Negative for abdominal distention, abdominal pain, blood in stool, constipation, diarrhea and nausea.  ?Genitourinary:  Negative for difficulty urinating, dysuria, flank pain, frequency and hematuria.  ?Musculoskeletal:  Negative for back pain and joint swelling.  ?Skin:  Negative for rash.  ? ? ?Past Medical History:  ?Diagnosis Date  ? Arthritis   ? Blood transfusion without reported diagnosis   ? Family hx-breast malignancy   ? Hyperlipidemia   ? Hypertension   ? Scoliosis of lumbar region due to degenerative disease of spine in adult   ? Smoking 1/2 pack a day or less 04/12/2019  ? Vitamin D deficiency 05/10/2019  ? ?  ?Social History  ? ?Socioeconomic History  ? Marital status: Divorced  ?  Spouse name: Not on file  ? Number of children: Not on file   ? Years of education: Not on file  ? Highest education level: Not on file  ?Occupational History  ? Not on file  ?Tobacco Use  ? Smoking status: Every Day  ?  Packs/day: 0.50  ?  Types: Cigarettes  ? Smokeless tobacco: Never  ?Vaping Use  ? Vaping Use: Never used  ?Substance and Sexual Activity  ? Alcohol use: No  ? Drug use: No  ? Sexual activity: Never  ?Other Topics Concern  ? Not on file  ?Social History Narrative  ? Social Documentation  ? ?Social Determinants of Health  ? ?Financial Resource Strain: Not on file  ?Food Insecurity: Not on file  ?Transportation Needs: Not on file  ?Physical Activity: Not on file  ?Stress: Not on file  ?Social Connections: Not on file  ?Intimate Partner Violence: Not on file  ? ? ?Past Surgical History:  ?Procedure Laterality Date  ? ABDOMINAL HYSTERECTOMY    ? ? ?Family History  ?Problem Relation Age of Onset  ? Colon cancer Neg Hx   ? Colon polyps Neg Hx   ? Esophageal cancer Neg Hx   ? Rectal cancer Neg Hx   ? Stomach cancer Neg Hx   ? ? ?No Known Allergies ? ?Current Outpatient Medications on File Prior to  Visit  ?Medication Sig Dispense Refill  ? Accu-Chek Softclix Lancets lancets daily.    ? albuterol (VENTOLIN HFA) 108 (90 Base) MCG/ACT inhaler Inhale 2 puffs into the lungs every 6 (six) hours as needed. 18 g 0  ? atorvastatin (LIPITOR) 40 MG tablet Take 1 tablet (40 mg total) by mouth daily. 120 tablet 0  ? Blood Glucose Monitoring Suppl (ACCU-CHEK GUIDE ME) w/Device KIT See admin instructions.    ? fluticasone (FLONASE) 50 MCG/ACT nasal spray Place 2 sprays into both nostrils daily. 16 g 1  ? glucose blood (ACCU-CHEK GUIDE) test strip TEST ONCE DAILY 100 strip 3  ? hydrochlorothiazide (HYDRODIURIL) 25 MG tablet Take 1 tablet (25 mg total) by mouth daily. 30 tablet 0  ? hydrochlorothiazide (HYDRODIURIL) 25 MG tablet Take 1 tablet (25 mg total) by mouth daily. 30 tablet 2  ? HYDROcodone bit-homatropine (HYCODAN) 5-1.5 MG/5ML syrup Take 5 mLs by mouth every 8 (eight) hours  as needed for cough. 120 mL 0  ? lisinopril (ZESTRIL) 40 MG tablet Take 1 tablet (40 mg total) by mouth daily. 30 tablet 0  ? metFORMIN (GLUCOPHAGE) 500 MG tablet TAKE 1 TABLET(500 MG) BY MOUTH DAILY WITH BREAKFAST 90 tablet 1  ? methocarbamol (ROBAXIN) 750 MG tablet TAKE 1 TABLET(750 MG) BY MOUTH EVERY 8 HOURS AS NEEDED FOR MUSCLE SPASMS 30 tablet 1  ? ?No current facility-administered medications on file prior to visit.  ? ? ?BP 124/86   Pulse 83   Temp 98 ?F (36.7 ?C)   Resp 18   Ht _0  (1.549 m)   Wt 176 lb 3.2 oz (79.9 kg)   SpO2 100%   BMI 33.29 kg/m?  ?  ?   ?Objective:  ? Physical Exam ? ?General ?Mental Status- Alert. General Appearance- Not in acute distress.  ? ?Skin ?General: Color- Normal Color. Moisture- Normal Moisture. ? ?Neck ?Carotid Arteries- Normal color. Moisture- Normal Moisture. No carotid bruits. No JVD. ? ?Chest and Lung Exam ?Auscultation: ?Breath Sounds:-Normal. ? ?Cardiovascular ?Auscultation:Rythm- Regular. ?Murmurs & Other Heart Sounds:Auscultation of the heart reveals- No Murmurs. ? ?Abdomen ?Inspection:-Inspeection Normal. ?Palpation/Percussion:Note:No mass. Palpation and Percussion of the abdomen reveal- Non Tender, Non Distended + BS, no rebound or guarding. ? ? ?Neurologic ?Cranial Nerve exam:- CN III-XII intact(No nystagmus), symmetric smile. ?Strength:- 5/5 equal and symmetric strength both upper and lower extremities.  ? ? ?   ?Assessment & Plan:  ? ?Patient Instructions  ?Htn- bp is well controlled. Continue lisinopril 40 mg daily and hctz 25 mg daily. BP better today. ? ?Smoker- placed referral for ct lung cancer screening. Encourage to quite smoking. ? ?High cholesterol. Will get cmp and lipid panel today. Presenlty on atorvastatin 40 mg daily. ? ?For diabetes get a1c. Presenlty on metofrmin 500 mg daily.  ? ?Below is The 10-year ASCVD risk score (Arnett DK, et al., 2019) is: 33.1% ?  Values used to calculate the score: ?    Age: 73 years ?    Sex: Female ?    Is  Non-Hispanic African American: Yes ?    Diabetic: Yes ?    Tobacco smoker: Yes ?    Systolic Blood Pressure: 088 mmHg ?    Is BP treated: Yes ?    HDL Cholesterol: 39.1 mg/dL ?    Total Cholesterol: 235 mg/dL   ? ?Follow up date to be determined after lab review. Usually for diabetes best to see every 3 months.  ? ? ?Mackie Pai, PA-C  ?

## 2021-09-17 NOTE — Addendum Note (Signed)
Addended by: Anabel Halon on: 09/17/2021 07:28 PM ? ? Modules accepted: Orders ? ?

## 2021-09-17 NOTE — Patient Instructions (Addendum)
Htn- bp is well controlled. Continue lisinopril 40 mg daily and hctz 25 mg daily. BP better today. ? ?Smoker- placed referral for ct lung cancer screening. Encourage to quite smoking. ? ?High cholesterol. Will get cmp and lipid panel today. Presenlty on atorvastatin 40 mg daily. ? ?For diabetes get a1c. Presenlty on metofrmin 500 mg daily.  ? ?Below is The 10-year ASCVD risk score (Arnett DK, et al., 2019) is: 33.1% ?  Values used to calculate the score: ?    Age: 61 years ?    Sex: Female ?    Is Non-Hispanic African American: Yes ?    Diabetic: Yes ?    Tobacco smoker: Yes ?    Systolic Blood Pressure: 791 mmHg ?    Is BP treated: Yes ?    HDL Cholesterol: 39.1 mg/dL ?    Total Cholesterol: 235 mg/dL   ? ?Follow up date to be determined after lab review. Usually for diabetes best to see every 3 months. ?

## 2021-09-23 ENCOUNTER — Other Ambulatory Visit: Payer: Self-pay | Admitting: Medical

## 2021-09-24 ENCOUNTER — Inpatient Hospital Stay (HOSPITAL_BASED_OUTPATIENT_CLINIC_OR_DEPARTMENT_OTHER): Admission: RE | Admit: 2021-09-24 | Payer: No Typology Code available for payment source | Source: Ambulatory Visit

## 2021-09-26 ENCOUNTER — Other Ambulatory Visit: Payer: Self-pay | Admitting: Medical

## 2021-09-26 DIAGNOSIS — M62830 Muscle spasm of back: Secondary | ICD-10-CM

## 2021-10-01 ENCOUNTER — Other Ambulatory Visit: Payer: Self-pay | Admitting: Medical

## 2021-10-01 ENCOUNTER — Ambulatory Visit (HOSPITAL_BASED_OUTPATIENT_CLINIC_OR_DEPARTMENT_OTHER)
Admission: RE | Admit: 2021-10-01 | Discharge: 2021-10-01 | Disposition: A | Discharge status: Home / Self Care | Payer: No Typology Code available for payment source | Source: Ambulatory Visit | Attending: Medical | Admitting: Medical

## 2021-10-01 ENCOUNTER — Encounter (HOSPITAL_BASED_OUTPATIENT_CLINIC_OR_DEPARTMENT_OTHER): Payer: Self-pay

## 2021-10-01 DIAGNOSIS — Z1231 Encounter for screening mammogram for malignant neoplasm of breast: Secondary | ICD-10-CM | POA: Insufficient documentation

## 2021-10-21 ENCOUNTER — Other Ambulatory Visit: Payer: Self-pay | Admitting: Emergency Medicine

## 2021-10-21 DIAGNOSIS — M62830 Muscle spasm of back: Secondary | ICD-10-CM

## 2021-11-05 ENCOUNTER — Other Ambulatory Visit: Payer: Self-pay

## 2021-11-05 DIAGNOSIS — F17210 Nicotine dependence, cigarettes, uncomplicated: Secondary | ICD-10-CM

## 2021-11-05 DIAGNOSIS — Z87891 Personal history of nicotine dependence: Secondary | ICD-10-CM

## 2021-11-05 DIAGNOSIS — Z122 Encounter for screening for malignant neoplasm of respiratory organs: Secondary | ICD-10-CM

## 2021-11-07 ENCOUNTER — Other Ambulatory Visit: Payer: Self-pay | Admitting: Medical

## 2021-12-03 ENCOUNTER — Encounter: Payer: Self-pay | Admitting: Family Medicine

## 2021-12-03 ENCOUNTER — Ambulatory Visit (INDEPENDENT_AMBULATORY_CARE_PROVIDER_SITE_OTHER): Payer: No Typology Code available for payment source | Admitting: Family Medicine

## 2021-12-03 VITALS — BP 139/67 | HR 79 | Ht 60.0 in | Wt 179.0 lb

## 2021-12-03 DIAGNOSIS — Z01419 Encounter for gynecological examination (general) (routine) without abnormal findings: Secondary | ICD-10-CM

## 2021-12-03 MED ORDER — IMIQUIMOD 5 % EX CREA: TOPICAL_CREAM | CUTANEOUS | 5 refills | Status: AC

## 2021-12-03 NOTE — Progress Notes (Signed)
Patient presents for well woman exam. Last mammogram October 01, 2021. Patient has had partial hysterectomy- fibroids. Kathrene Alu RN

## 2021-12-04 NOTE — Progress Notes (Signed)
GYNECOLOGY ANNUAL PREVENTATIVE CARE ENCOUNTER NOTE  Subjective:   Janice Jacobs is a 61 y.o. [phone] female here for a routine annual gynecologic exam.  Current complaints: none.   Denies abnormal vaginal bleeding, discharge, pelvic pain, problems with intercourse or other gynecologic concerns.    Gynecologic History No LMP recorded. Patient has had a hysterectomy. Contraception: status post hysterectomy Last Pap: several years ago. Results were: normal Last mammogram: 09/2021. Results were: normal   The pregnancy intention screening data noted above was reviewed. Potential methods of contraception were discussed. The patient elected to proceed with No data recorded.   Obstetric History OB History  Gravida Para Term Preterm AB Living  _0 SAB IAB Ectopic Multiple Live Births          3    # Outcome Date GA Lbr Len/2nd Weight Sex Delivery Anes PTL Lv  3 Term     F Vag-Spont     2 Term     F Vag-Spont     1 Term     F Vag-Spont       Past Medical History:  Diagnosis Date   Arthritis    Blood transfusion without reported diagnosis    Family hx-breast malignancy    Hyperlipidemia    Hypertension    Scoliosis of lumbar region due to degenerative disease of spine in adult    Smoking 1/2 pack a day or less 04/12/2019   Vitamin D deficiency 05/10/2019    Past Surgical History:  Procedure Laterality Date   ABDOMINAL HYSTERECTOMY      Current Outpatient Medications on File Prior to Visit  Medication Sig Dispense Refill   Accu-Chek Softclix Lancets lancets daily.     albuterol (VENTOLIN HFA) 108 (90 Base) MCG/ACT inhaler INHALE 2 PUFFS INTO THE LUNGS EVERY 6 HOURS AS NEEDED 18 g 5   atorvastatin (LIPITOR) 40 MG tablet Take 1 tablet (40 mg total) by mouth daily. 90 tablet 3   Blood Glucose Monitoring Suppl (ACCU-CHEK GUIDE ME) w/Device KIT See admin instructions.     fluticasone (FLONASE) 50 MCG/ACT nasal spray Place 2 sprays into both nostrils daily. 16 g 1    glucose blood (ACCU-CHEK GUIDE) test strip TEST ONCE DAILY 100 strip 3   hydrochlorothiazide (HYDRODIURIL) 25 MG tablet Take 1 tablet (25 mg total) by mouth daily. 30 tablet 0   hydrochlorothiazide (HYDRODIURIL) 25 MG tablet Take 1 tablet (25 mg total) by mouth daily. 30 tablet 2   HYDROcodone bit-homatropine (HYCODAN) 5-1.5 MG/5ML syrup Take 5 mLs by mouth every 8 (eight) hours as needed for cough. 120 mL 0   lisinopril (ZESTRIL) 40 MG tablet TAKE 1 TABLET(40 MG) BY MOUTH DAILY 90 tablet 1   metFORMIN (GLUCOPHAGE) 500 MG tablet TAKE 1 TABLET(500 MG) BY MOUTH DAILY WITH BREAKFAST 90 tablet 1   methocarbamol (ROBAXIN) 750 MG tablet TAKE 1 TABLET(750 MG) BY MOUTH EVERY 8 HOURS AS NEEDED FOR MUSCLE SPASMS 30 tablet 1   No current facility-administered medications on file prior to visit.    No Known Allergies  Social History   Socioeconomic History   Marital status: Divorced    Spouse name: Not on file   Number of children: Not on file   Years of education: Not on file   Highest education level: Not on file  Occupational History   Not on file  Tobacco Use   Smoking status: Every Day    Packs/day: 0.50  Types: Cigarettes   Smokeless tobacco: Never  Vaping Use   Vaping Use: Never used  Substance and Sexual Activity   Alcohol use: No   Drug use: No   Sexual activity: Never  Other Topics Concern   Not on file  Social History Narrative   Social Documentation   Social Determinants of Health   Financial Resource Strain: Not on file  Food Insecurity: Not on file  Transportation Needs: Not on file  Physical Activity: Not on file  Stress: Not on file  Social Connections: Not on file  Intimate Partner Violence: Not on file    Family History  Problem Relation Age of Onset   Colon cancer Neg Hx    Colon polyps Neg Hx    Esophageal cancer Neg Hx    Rectal cancer Neg Hx    Stomach cancer Neg Hx     The following portions of the patient's history were reviewed and updated as  appropriate: allergies, current medications, past family history, past medical history, past social history, past surgical history and problem list.  Review of Systems Pertinent items are noted in HPI.   Objective:  BP 139/67   Pulse 79   Ht 5' (1.524 m)   Wt 179 lb (81.2 kg)   BMI 34.96 kg/m  Wt Readings from Last 3 Encounters:  12/03/21 179 lb (81.2 kg)  09/17/21 176 lb 3.2 oz (79.9 kg)  06/25/21 183 lb 6.4 oz (83.2 kg)     Chaperone present during exam  CONSTITUTIONAL: Well-developed, well-nourished female in no acute distress.  HENT:  Normocephalic, atraumatic, External right and left ear normal. Oropharynx is clear and moist EYES: Conjunctivae and EOM are normal. Pupils are equal, round, and reactive to light. No scleral icterus.  NECK: Normal range of motion, supple, no masses.  Normal thyroid.   CARDIOVASCULAR: Normal heart rate noted, regular rhythm RESPIRATORY: Clear to auscultation bilaterally. Effort and breath sounds normal, no problems with respiration noted. BREASTS: Symmetric in size. No masses, skin changes, nipple drainage, or lymphadenopathy. ABDOMEN: Soft, normal bowel sounds, no distention noted.  No tenderness, rebound or guarding.  PELVIC: Normal appearing external genitalia; normal appearing vaginal mucosa. Cervix and uterus surgically absent.  MUSCULOSKELETAL: Normal range of motion. No tenderness.  No cyanosis, clubbing, or edema.  2+ distal pulses. SKIN: Skin is warm and dry. No rash noted. Not diaphoretic. No erythema. No pallor. NEUROLOGIC: Alert and oriented to person, place, and time. Normal reflexes, muscle tone coordination. No cranial nerve deficit noted. PSYCHIATRIC: Normal mood and affect. Normal behavior. Normal judgment and thought content.  Assessment:  Annual gynecologic examination with pap smear   Plan:  1. Well woman exam with routine gynecological exam Mammogram reviewed. PAP not needed.   Routine preventative health maintenance  measures emphasized. Please refer to After Visit Summary for other counseling recommendations.    Loma Boston, [place] for [employer]

## 2021-12-10 ENCOUNTER — Encounter: Payer: Self-pay | Admitting: Acute Care

## 2021-12-10 ENCOUNTER — Ambulatory Visit (INDEPENDENT_AMBULATORY_CARE_PROVIDER_SITE_OTHER): Payer: No Typology Code available for payment source | Admitting: Acute Care

## 2021-12-10 DIAGNOSIS — F17210 Nicotine dependence, cigarettes, uncomplicated: Secondary | ICD-10-CM

## 2021-12-10 NOTE — Progress Notes (Signed)
Virtual Visit via Telephone Note  I connected with Radonna Ricker on 11/18/21 at  3:30 PM EDT by telephone and verified that I am speaking with the correct person using two identifiers.  Location: Patient:  At home Provider: [place], [place], [place], [address]    I discussed the limitations, risks, security and privacy concerns of performing an evaluation and management service by telephone and the availability of in person appointments. I also discussed with the patient that there may be a patient responsible charge related to this service. The patient expressed understanding and agreed to proceed.   Shared Decision Making Visit Lung Cancer Screening Program [phone])   Eligibility: Age 61 y.o. Pack Years Smoking History Calculation 26 pack year smoking history (# packs/per year x # years smoked) Recent History of coughing up blood  no Unexplained weight loss? no ( >Than 15 pounds within the last 6 months ) Prior History Lung / other cancer no (Diagnosis within the last 5 years already requiring surveillance chest CT Scans). Smoking Status Current Smoker Former Smokers: Years since quit:  NA  Quit Date:  NA  Visit Components: Discussion included one or more decision making aids. yes Discussion included risk/benefits of screening. yes Discussion included potential follow up diagnostic testing for abnormal scans. yes Discussion included meaning and risk of over diagnosis. yes Discussion included meaning and risk of False Positives. yes Discussion included meaning of total radiation exposure. yes  Counseling Included: Importance of adherence to annual lung cancer LDCT screening. yes Impact of comorbidities on ability to participate in the program. yes Ability and willingness to under diagnostic treatment. yes  Smoking Cessation Counseling: Current Smokers:  Discussed importance of smoking cessation. yes Information about tobacco cessation classes and  interventions provided to patient. yes Patient provided with "ticket" for LDCT Scan. yes Symptomatic Patient. no  Counseling NA Diagnosis Code: Tobacco Use Z72.0 Asymptomatic Patient yes  Counseling (Intermediate counseling: > three minutes counseling) T0626 Former Smokers:  Discussed the importance of maintaining cigarette abstinence. yes Diagnosis Code: Personal History of Nicotine Dependence. R48.546 Information about tobacco cessation classes and interventions provided to patient. Yes Patient provided with "ticket" for LDCT Scan. yes Written Order for Lung Cancer Screening with LDCT placed in Epic. Yes (CT Chest Lung Cancer Screening Low Dose W/O CM) [id number] Z12.2-Screening of respiratory organs Z87.891-Personal history of nicotine dependence  I have spent 25 minutes of face to face/ virtual visit   time with  Ms. Jacquez discussing the risks and benefits of lung cancer screening. We viewed / discussed a power point together that explained in detail the above noted topics. We paused at intervals to allow for questions to be asked and answered to ensure understanding.We discussed that the single most powerful action that she can take to decrease her risk of developing lung cancer is to quit smoking. We discussed whether or not she is ready to commit to setting a quit date. We discussed options for tools to aid in quitting smoking including nicotine replacement therapy, non-nicotine medications, support groups, Quit Smart classes, and behavior modification. We discussed that often times setting smaller, more achievable goals, such as eliminating 1 cigarette a day for a week and then 2 cigarettes a day for a week can be helpful in slowly decreasing the number of cigarettes smoked. This allows for a sense of accomplishment as well as providing a clinical benefit. I provided  her  with smoking cessation  information  with contact information for community resources, classes,  free nicotine replacement  therapy, and access to mobile apps, text messaging, and on-line smoking cessation help. I have also provided  her  the office contact information in the event she needs to contact me, or the screening staff. We discussed the time and location of the scan, and that either Doroteo Glassman RN, Joella Prince, RN  or I will call / send a letter with the results within 24-72 hours of receiving them. The patient verbalized understanding of all of  the above and had no further questions upon leaving the office. They have my contact information in the event they have any further questions.  I spent 3 minutes counseling on smoking cessation and the health risks of continued tobacco abuse.  I explained to the patient that there has been a high incidence of coronary artery disease noted on these exams. I explained that this is a non-gated exam therefore degree or severity cannot be determined. This patient is on statin therapy. I have asked the patient to follow-up with their PCP regarding any incidental finding of coronary artery disease and management with diet or medication as their PCP  feels is clinically indicated. The patient verbalized understanding of the above and had no further questions upon completion of the visit.      Magdalen Spatz, NP 12/10/2021

## 2021-12-10 NOTE — Patient Instructions (Signed)
Thank you for participating in the [facility]. It was our pleasure to meet you today. We will call you with the results of your scan within the next few days. Your scan will be assigned a Lung RADS category score by the physicians reading the scans.  This Lung RADS score determines follow up scanning.  See below for description of categories, and follow up screening recommendations. We will be in touch to schedule your follow up screening annually or based on recommendations of our providers. We will fax a copy of your scan results to your Primary Care Physician, or the physician who referred you to the program, to ensure they have the results. Please call the office if you have any questions or concerns regarding your scanning experience or results.  Our office number is [phone]. Please speak with Denise Phelps, RN. , or  Denise Buckner RN, They are  our Lung Cancer Screening RN.'s If They are unavailable when you call, Please leave a message on the voice mail. We will return your call at our earliest convenience.This voice mail is monitored several times a day.  Remember, if your scan is normal, we will scan you annually as long as you continue to meet the criteria for the program. (Age 55-77, Current smoker or smoker who has quit within the last 15 years). If you are a smoker, remember, quitting is the single most powerful action that you can take to decrease your risk of lung cancer and other pulmonary, breathing related problems. We know quitting is hard, and we are here to help.  Please let us know if there is anything we can do to help you meet your goal of quitting. If you are a former smoker, congratulations. We are proud of you! Remain smoke free! Remember you can refer friends or family members through the number above.  We will screen them to make sure they meet criteria for the program. Thank you for helping us take better care of you by  participating in Lung Screening.  You can receive free nicotine replacement therapy ( patches, gum or mints) by calling 1-800-QUIT NOW. Please call so we can get you on the path to becoming  a non-smoker. I know it is hard, but you can do this!  Lung RADS Categories:  Lung RADS 1: no nodules or definitely non-concerning nodules.  Recommendation is for a repeat annual scan in 12 months.  Lung RADS 2:  nodules that are non-concerning in appearance and behavior with a very low likelihood of becoming an active cancer. Recommendation is for a repeat annual scan in 12 months.  Lung RADS 3: nodules that are probably non-concerning , includes nodules with a low likelihood of becoming an active cancer.  Recommendation is for a 6-month repeat screening scan. Often noted after an upper respiratory illness. We will be in touch to make sure you have no questions, and to schedule your 6-month scan.  Lung RADS 4 A: nodules with concerning findings, recommendation is most often for a follow up scan in 3 months or additional testing based on our provider's assessment of the scan. We will be in touch to make sure you have no questions and to schedule the recommended 3 month follow up scan.  Lung RADS 4 B:  indicates findings that are concerning. We will be in touch with you to schedule additional diagnostic testing based on our provider's  assessment of the scan.  Other options for assistance in smoking cessation (   As covered by your insurance benefits)  Hypnosis for smoking cessation  [employer] [phone]  Acupuncture for smoking cessation  [facility] [phone]   

## 2021-12-12 ENCOUNTER — Other Ambulatory Visit: Payer: Self-pay | Admitting: Medical

## 2021-12-12 DIAGNOSIS — M62830 Muscle spasm of back: Secondary | ICD-10-CM

## 2021-12-12 DIAGNOSIS — E119 Type 2 diabetes mellitus without complications: Secondary | ICD-10-CM

## 2021-12-17 ENCOUNTER — Ambulatory Visit (HOSPITAL_BASED_OUTPATIENT_CLINIC_OR_DEPARTMENT_OTHER)
Admission: RE | Admit: 2021-12-17 | Discharge: 2021-12-17 | Disposition: A | Discharge status: Home / Self Care | Payer: No Typology Code available for payment source | Source: Ambulatory Visit | Attending: Medical | Admitting: Medical

## 2021-12-17 ENCOUNTER — Other Ambulatory Visit: Payer: Self-pay | Admitting: Acute Care

## 2021-12-17 DIAGNOSIS — Z87891 Personal history of nicotine dependence: Secondary | ICD-10-CM | POA: Diagnosis present

## 2021-12-17 DIAGNOSIS — Z122 Encounter for screening for malignant neoplasm of respiratory organs: Secondary | ICD-10-CM | POA: Insufficient documentation

## 2021-12-17 DIAGNOSIS — F17210 Nicotine dependence, cigarettes, uncomplicated: Secondary | ICD-10-CM | POA: Diagnosis present

## 2021-12-31 ENCOUNTER — Other Ambulatory Visit: Payer: Self-pay | Admitting: *Deleted

## 2021-12-31 MED ORDER — ONETOUCH ULTRA VI STRP: ORAL_STRIP | 1 refills | Status: DC

## 2021-12-31 MED ORDER — ONETOUCH ULTRA 2 W/DEVICE KIT: PACK | 0 refills | Status: AC

## 2021-12-31 MED ORDER — ONETOUCH DELICA LANCETS 33G MISC: 1 refills | Status: AC

## 2022-01-21 ENCOUNTER — Encounter (INDEPENDENT_AMBULATORY_CARE_PROVIDER_SITE_OTHER): Payer: Self-pay

## 2022-03-04 ENCOUNTER — Ambulatory Visit: Payer: No Typology Code available for payment source | Admitting: Family Medicine

## 2022-03-18 ENCOUNTER — Ambulatory Visit: Payer: No Typology Code available for payment source | Admitting: Medical

## 2022-03-25 ENCOUNTER — Encounter: Payer: Self-pay | Admitting: Medical

## 2022-03-25 ENCOUNTER — Ambulatory Visit: Payer: No Typology Code available for payment source | Admitting: Medical

## 2022-03-25 ENCOUNTER — Ambulatory Visit (INDEPENDENT_AMBULATORY_CARE_PROVIDER_SITE_OTHER): Payer: No Typology Code available for payment source | Admitting: Medical

## 2022-03-25 VITALS — BP 140/85 | HR 73 | Resp 18 | Ht 60.0 in | Wt 181.0 lb

## 2022-03-25 DIAGNOSIS — E782 Mixed hyperlipidemia: Secondary | ICD-10-CM

## 2022-03-25 DIAGNOSIS — F17200 Nicotine dependence, unspecified, uncomplicated: Secondary | ICD-10-CM | POA: Diagnosis not present

## 2022-03-25 DIAGNOSIS — I10 Essential (primary) hypertension: Secondary | ICD-10-CM

## 2022-03-25 DIAGNOSIS — M2011 Hallux valgus (acquired), right foot: Secondary | ICD-10-CM

## 2022-03-25 DIAGNOSIS — E119 Type 2 diabetes mellitus without complications: Secondary | ICD-10-CM | POA: Diagnosis not present

## 2022-03-25 DIAGNOSIS — Z0100 Encounter for examination of eyes and vision without abnormal findings: Secondary | ICD-10-CM

## 2022-03-25 DIAGNOSIS — M2012 Hallux valgus (acquired), left foot: Secondary | ICD-10-CM

## 2022-03-25 LAB — LIPID PANEL
Cholesterol: 204 mg/dL — ABNORMAL HIGH (ref 0–200)
HDL: 42.8 mg/dL (ref >39.00)
LDL Cholesterol: 140 mg/dL — ABNORMAL HIGH (ref 0–99)
NonHDL: 161.55
Total CHOL/HDL Ratio: 5
Triglycerides: 110 mg/dL (ref 0.0–149.0)
VLDL: 22 mg/dL (ref 0.0–40.0)

## 2022-03-25 LAB — COMPREHENSIVE METABOLIC PANEL
ALT: 9 U/L (ref 0–35)
AST: 11 U/L (ref 0–37)
Albumin: 4.2 g/dL (ref 3.5–5.2)
Alkaline Phosphatase: 71 U/L (ref 39–117)
BUN: 15 mg/dL (ref 6–23)
CO2: 28 mEq/L (ref 19–32)
Calcium: 9.5 mg/dL (ref 8.4–10.5)
Chloride: 105 mEq/L (ref 96–112)
Creatinine, Ser: 0.84 mg/dL (ref 0.40–1.20)
GFR: 76.34 mL/min (ref >60.00)
Glucose, Bld: 96 mg/dL (ref 70–99)
Potassium: 4.4 mEq/L (ref 3.5–5.1)
Sodium: 140 mEq/L (ref 135–145)
Total Bilirubin: 0.3 mg/dL (ref 0.2–1.2)
Total Protein: 6.9 g/dL (ref 6.0–8.3)

## 2022-03-25 LAB — HEMOGLOBIN A1C: Hgb A1c MFr Bld: 6.3 % (ref 4.6–6.5)

## 2022-03-25 MED ORDER — LISINOPRIL 40 MG PO TABS: ORAL_TABLET | ORAL | 1 refills | Status: DC

## 2022-03-25 NOTE — Patient Instructions (Addendum)
Htn- bp borderline controlled today but did not take bp meds. Please take when you get home. Also recommend getting bp cuff over the coutner. Check periodically particularly on days that you have to use low dose ibuprofen.  Diabetes- get a1c and cmp today. Continue metoformin.  High cholesterol- lipid panel today. Continue atorvastatin 40 mg daily.  Recommend smoking cessation.  For arthraliga and lumber back pain. Known degenerative change on prior lumbar xray. Can use combination of tylenol 650 mg and low to mid dose Ibuprofen 200-400 mg every 8 hours. Remember to check bp if using nsaids daily to make sure not increasing bp too much.  Referred for diabetic eye exam.  For bilateral hallux valgus referred to podiatrist.    Follow up date to be determined after lab review

## 2022-03-25 NOTE — Progress Notes (Signed)
Subjective:    Patient ID: Janice Jacobs, female    DOB: 21-Jul-1960, 61 y.o.   MRN: [id number]  HPI Pt in for follow up.  Pt states she missed early appt and rescheduled but did not take bp meds. Last visit her bp was well controlled. Pt is working a lot of days recently 7 days a week at [employer]. Continue lisinopril 40 mg daily and hctz 25 mg daily.  She has bilateral shoulder pain, low back pain  and bilateral ankle pain.   In past lumbar xray showed below.  IMPRESSION: 1. Minimal anterior slip L4 and L5 secondary to facet degenerative changes. 2. Minimal L3-4 and L4-5 disc space narrowing. Mild L5-S1 disc space narrowing.  Pt takes tylenol 650 mg 2 tab at at time. Some days up to 10 a day.   High cholesterol- she is on atorvastatin 40 mg daily.   Diabetes- last a1c in April was 6.4. On metformin 500 mg daily.     Review of Systems  Constitutional:  Negative for chills, fatigue and fever.  HENT:  Negative for congestion and drooling.   Respiratory:  Negative for chest tightness, shortness of breath and wheezing.   Cardiovascular:  Negative for chest pain and palpitations.  Gastrointestinal:  Negative for abdominal pain.  Genitourinary:  Negative for dyspareunia and dysuria.  Musculoskeletal:  Positive for arthralgias. Negative for back pain and gait problem.  Skin:  Negative for rash.  Neurological:  Negative for dizziness, tremors, syncope, light-headedness and numbness.  Hematological:  Negative for adenopathy. Does not bruise/bleed easily.  Psychiatric/Behavioral:  Negative for behavioral problems and dysphoric mood. The patient is not hyperactive.     Past Medical History:  Diagnosis Date   Arthritis    Blood transfusion without reported diagnosis    Family hx-breast malignancy    Hyperlipidemia    Hypertension    Scoliosis of lumbar region due to degenerative disease of spine in adult    Smoking 1/2 pack a day or less 04/12/2019   Vitamin D deficiency  05/10/2019     Social History   Socioeconomic History   Marital status: Divorced    Spouse name: Not on file   Number of children: Not on file   Years of education: Not on file   Highest education level: Not on file  Occupational History   Not on file  Tobacco Use   Smoking status: Every Day    Packs/day: 0.50    Types: Cigarettes   Smokeless tobacco: Never  Vaping Use   Vaping Use: Never used  Substance and Sexual Activity   Alcohol use: No   Drug use: No   Sexual activity: Never  Other Topics Concern   Not on file  Social History Narrative   Social Documentation   Social Determinants of Health   Financial Resource Strain: Not on file  Food Insecurity: Not on file  Transportation Needs: Not on file  Physical Activity: Not on file  Stress: Not on file  Social Connections: Not on file  Intimate Partner Violence: Not on file    Past Surgical History:  Procedure Laterality Date   ABDOMINAL HYSTERECTOMY      Family History  Problem Relation Age of Onset   Colon cancer Neg Hx    Colon polyps Neg Hx    Esophageal cancer Neg Hx    Rectal cancer Neg Hx    Stomach cancer Neg Hx     No Known Allergies  Current Outpatient Medications on  File Prior to Visit  Medication Sig Dispense Refill   albuterol (VENTOLIN HFA) 108 (90 Base) MCG/ACT inhaler INHALE 2 PUFFS INTO THE LUNGS EVERY 6 HOURS AS NEEDED 18 g 5   Blood Glucose Monitoring Suppl (ONE TOUCH ULTRA 2) w/Device KIT Use to test blood sugar once a day.  Dx code: E11.9 1 kit 0   fluticasone (FLONASE) 50 MCG/ACT nasal spray Place 2 sprays into both nostrils daily. 16 g 1   glucose blood (ONETOUCH ULTRA) test strip Use to test blood sugar once a day.  Dx code: E11.9 100 each 1   hydrochlorothiazide (HYDRODIURIL) 25 MG tablet Take 1 tablet (25 mg total) by mouth daily. 30 tablet 0   hydrochlorothiazide (HYDRODIURIL) 25 MG tablet TAKE 1 TABLET(25 MG) BY MOUTH DAILY 90 tablet 1   HYDROcodone bit-homatropine (HYCODAN)  5-1.5 MG/5ML syrup Take 5 mLs by mouth every 8 (eight) hours as needed for cough. 120 mL 0   imiquimod (ALDARA) 5 % cream Apply topically 3 (three) times a week. Apply until total clearance or maximum of 16 weeks 24 each 5   lisinopril (ZESTRIL) 40 MG tablet TAKE 1 TABLET(40 MG) BY MOUTH DAILY 90 tablet 1   metFORMIN (GLUCOPHAGE) 500 MG tablet TAKE 1 TABLET(500 MG) BY MOUTH DAILY WITH BREAKFAST 90 tablet 1   methocarbamol (ROBAXIN) 750 MG tablet TAKE 1 TABLET(750 MG) BY MOUTH EVERY 8 HOURS AS NEEDED FOR MUSCLE SPASMS 30 tablet 1   OneTouch Delica Lancets 87F MISC Use to test blood sugar once a day.  Dx code: E11.9 100 each 1   atorvastatin (LIPITOR) 40 MG tablet Take 1 tablet (40 mg total) by mouth daily. 90 tablet 3   No current facility-administered medications on file prior to visit.    BP (!) 140/85   Pulse 73   Resp 18   Ht 5' (1.524 m)   Wt 181 lb (82.1 kg)   SpO2 99%   BMI 35.35 kg/m        Objective:   Physical Exam  General Mental Status- Alert. General Appearance- Not in acute distress.   Skin General: Color- Normal Color. Moisture- Normal Moisture.  Neck Carotid Arteries- Normal color. Moisture- Normal Moisture. No carotid bruits. No JVD.  Chest and Lung Exam Auscultation: Breath Sounds:-Normal.  Cardiovascular Auscultation:Rythm- Regular. Murmurs & Other Heart Sounds:Auscultation of the heart reveals- No Murmurs.  Abdomen Inspection:-Inspeection Normal. Palpation/Percussion:Note:No mass. Palpation and Percussion of the abdomen reveal- Non Tender, Non Distended + BS, no rebound or guarding.  Neurologic Cranial Nerve exam:- CN III-XII intact(No nystagmus), symmetric smile. /Intact  Strength:- 5/5 equal and symmetric strength both upper and lower extremities.   Lower ext- see quality metrics foot exam.    Assessment & Plan:   Patient Instructions  Htn- bp borderline controlled today but did not take bp meds. Please take when you get home. Also  recommend getting bp cuff over the coutner. Check periodically particularly on days that you have to use low dose ibuprofen.  Diabetes- get a1c and cmp today. Continue metoformin.  High cholesterol- lipid panel today. Continue atorvastatin 40 mg daily.  Recommend smoking cessation.  For arthraliga and lumber back pain. Known degenerative change on prior lumbar xray. Can use combination of tylenol 650 mg and low to mid dose Ibuprofen 200-400 mg every 8 hours. Remember to check bp if using nsaids daily to make sure not increasing bp too much.  Referred for diabetic eye exam.  For bilateral hallux valgus referred to podiatrist.  Follow up date to be determined after lab review   Mackie Pai, PA-C

## 2022-04-03 ENCOUNTER — Other Ambulatory Visit: Payer: Self-pay | Admitting: Medical

## 2022-04-03 DIAGNOSIS — M62830 Muscle spasm of back: Secondary | ICD-10-CM

## 2022-04-15 ENCOUNTER — Ambulatory Visit (INDEPENDENT_AMBULATORY_CARE_PROVIDER_SITE_OTHER): Payer: No Typology Code available for payment source

## 2022-04-15 ENCOUNTER — Other Ambulatory Visit: Payer: Self-pay | Admitting: Podiatry

## 2022-04-15 ENCOUNTER — Ambulatory Visit (INDEPENDENT_AMBULATORY_CARE_PROVIDER_SITE_OTHER): Payer: No Typology Code available for payment source | Admitting: Podiatry

## 2022-04-15 DIAGNOSIS — M779 Enthesopathy, unspecified: Secondary | ICD-10-CM

## 2022-04-15 DIAGNOSIS — M79671 Pain in right foot: Secondary | ICD-10-CM | POA: Diagnosis not present

## 2022-04-15 DIAGNOSIS — M7752 Other enthesopathy of left foot: Secondary | ICD-10-CM | POA: Diagnosis not present

## 2022-04-15 DIAGNOSIS — M79672 Pain in left foot: Secondary | ICD-10-CM

## 2022-04-15 NOTE — Progress Notes (Signed)
Subjective:  Patient ID: Janice Jacobs, female    DOB: 11-12-1960,  MRN: [id number]  Chief Complaint  Patient presents with   Toe Pain    61 y.o. female presents with the above complaint.  Patient presents with complaint left first metatarsophalangeal joint pain has progressive gotten worse hurts with ambulation worse with pressure she has not seen MRIs prior to seeing me.  She states that is dull achy pain sensation to both of her feet but the left side is worse side.  She has not seen anyone as prior to seeing me for this.  Pain scale 7 out of 10.   Review of Systems: Negative except as noted in the HPI. Denies N/V/F/Ch.  Past Medical History:  Diagnosis Date   Arthritis    Blood transfusion without reported diagnosis    Family hx-breast malignancy    Hyperlipidemia    Hypertension    Scoliosis of lumbar region due to degenerative disease of spine in adult    Smoking 1/2 pack a day or less 04/12/2019   Vitamin D deficiency 05/10/2019    Current Outpatient Medications:    albuterol (VENTOLIN HFA) 108 (90 Base) MCG/ACT inhaler, INHALE 2 PUFFS INTO THE LUNGS EVERY 6 HOURS AS NEEDED, Disp: 18 g, Rfl: 5   atorvastatin (LIPITOR) 40 MG tablet, Take 1 tablet (40 mg total) by mouth daily., Disp: 90 tablet, Rfl: 3   Blood Glucose Monitoring Suppl (ONE TOUCH ULTRA 2) w/Device KIT, Use to test blood sugar once a day.  Dx code: E11.9, Disp: 1 kit, Rfl: 0   fluticasone (FLONASE) 50 MCG/ACT nasal spray, Place 2 sprays into both nostrils daily., Disp: 16 g, Rfl: 1   glucose blood (ONETOUCH ULTRA) test strip, Use to test blood sugar once a day.  Dx code: E11.9, Disp: 100 each, Rfl: 1   hydrochlorothiazide (HYDRODIURIL) 25 MG tablet, Take 1 tablet (25 mg total) by mouth daily., Disp: 30 tablet, Rfl: 0   hydrochlorothiazide (HYDRODIURIL) 25 MG tablet, TAKE 1 TABLET(25 MG) BY MOUTH DAILY, Disp: 90 tablet, Rfl: 1   HYDROcodone bit-homatropine (HYCODAN) 5-1.5 MG/5ML syrup, Take 5 mLs by mouth every 8  (eight) hours as needed for cough., Disp: 120 mL, Rfl: 0   imiquimod (ALDARA) 5 % cream, Apply topically 3 (three) times a week. Apply until total clearance or maximum of 16 weeks, Disp: 24 each, Rfl: 5   lisinopril (ZESTRIL) 40 MG tablet, TAKE 1 TABLET(40 MG) BY MOUTH DAILY, Disp: 90 tablet, Rfl: 1   metFORMIN (GLUCOPHAGE) 500 MG tablet, TAKE 1 TABLET(500 MG) BY MOUTH DAILY WITH BREAKFAST, Disp: 90 tablet, Rfl: 1   methocarbamol (ROBAXIN) 750 MG tablet, TAKE 1 TABLET(750 MG) BY MOUTH EVERY 8 HOURS AS NEEDED FOR MUSCLE SPASMS, Disp: 30 tablet, Rfl: 1   OneTouch Delica Lancets 65Y MISC, Use to test blood sugar once a day.  Dx code: E11.9, Disp: 100 each, Rfl: 1  Social History   Tobacco Use  Smoking Status Every Day   Packs/day: 0.50   Types: Cigarettes  Smokeless Tobacco Never    No Known Allergies Objective:  There were no vitals filed for this visit. There is no height or weight on file to calculate BMI. Constitutional Well developed. Well nourished.  Vascular Dorsalis pedis pulses palpable bilaterally. Posterior tibial pulses palpable bilaterally. Capillary refill normal to all digits.  No cyanosis or clubbing noted. Pedal hair growth normal.  Neurologic Normal speech. Oriented to person, place, and time. Epicritic sensation to light touch grossly present  bilaterally.  Dermatologic Nails well groomed and normal in appearance. No open wounds. No skin lesions.  Orthopedic: Pain on palpation to the left first metatarsophalangeal joint deep intra-articular first MPJ pain noted pain with range of motion of the first metatarsophalangeal joint   Radiographs: 3 views of skeletally mature adult foot: Bony deformity noted with some component met adductus angle noted.  Sesamoid position is 6 out of 7.  Some arthritis noted the first metatarsophalangeal joint pes planovalgus foot structure noted posterior/plantar severe spurring noted.  No other bony abnormalities identified. Assessment:    1. Capsulitis of metatarsophalangeal (MTP) joint of left foot    Plan:  Patient was evaluated and treated and all questions answered.  Left first MTP capsulitis -All questions and concerns were discussed with the patient extensive detail given the amount of inflammation at present patient will benefit from steroid injection of decrease inflammatory component associate with pain.  Patient agrees with plan to proceed with steroid injection also discussed surgical options as well but she will get back to me when she is ready  No follow-ups on file.

## 2022-04-22 ENCOUNTER — Encounter: Payer: Self-pay | Admitting: Podiatry

## 2022-05-15 ENCOUNTER — Other Ambulatory Visit: Payer: Self-pay | Admitting: Medical

## 2022-05-15 DIAGNOSIS — E119 Type 2 diabetes mellitus without complications: Secondary | ICD-10-CM

## 2022-05-18 ENCOUNTER — Other Ambulatory Visit: Payer: Self-pay | Admitting: Medical

## 2022-05-30 ENCOUNTER — Other Ambulatory Visit: Payer: Self-pay | Admitting: Medical

## 2022-05-30 DIAGNOSIS — M62830 Muscle spasm of back: Secondary | ICD-10-CM

## 2022-06-24 ENCOUNTER — Ambulatory Visit: Payer: No Typology Code available for payment source | Admitting: Medical

## 2022-07-01 ENCOUNTER — Ambulatory Visit: Payer: No Typology Code available for payment source | Admitting: Medical

## 2022-07-24 ENCOUNTER — Other Ambulatory Visit: Payer: Self-pay | Admitting: Medical

## 2022-07-24 ENCOUNTER — Telehealth: Payer: Self-pay | Admitting: Medical

## 2022-07-24 DIAGNOSIS — M62830 Muscle spasm of back: Secondary | ICD-10-CM

## 2022-07-24 DIAGNOSIS — E119 Type 2 diabetes mellitus without complications: Secondary | ICD-10-CM

## 2022-07-24 MED ORDER — METFORMIN HCL 500 MG PO TABS: ORAL_TABLET | ORAL | 1 refills | Status: DC

## 2022-07-24 NOTE — Telephone Encounter (Signed)
Pt scheduled for Tuesday 2/13 but wanted to request a refill. Please advise.   methocarbamol (ROBAXIN) 750 MG tablet  metFORMIN (GLUCOPHAGE) 500 MG tablet  [facility] [id number] - [place], [place] - [address], [place] [address] Phone: [phone]  Fax: [phone]

## 2022-07-24 NOTE — Telephone Encounter (Signed)
Rx sent 

## 2022-07-27 NOTE — Telephone Encounter (Signed)
Sent in 10 tab rx of muscle relaxant. Ask pt to schedule follow up as I have not seen her since around October for this pain.

## 2022-07-28 ENCOUNTER — Ambulatory Visit: Payer: No Typology Code available for payment source | Admitting: Medical

## 2022-07-28 VITALS — BP 150/80 | HR 80 | Resp 18 | Ht 60.0 in | Wt 184.0 lb

## 2022-07-28 DIAGNOSIS — F17200 Nicotine dependence, unspecified, uncomplicated: Secondary | ICD-10-CM

## 2022-07-28 DIAGNOSIS — E782 Mixed hyperlipidemia: Secondary | ICD-10-CM | POA: Diagnosis not present

## 2022-07-28 DIAGNOSIS — I10 Essential (primary) hypertension: Secondary | ICD-10-CM

## 2022-07-28 DIAGNOSIS — E119 Type 2 diabetes mellitus without complications: Secondary | ICD-10-CM

## 2022-07-28 DIAGNOSIS — M62830 Muscle spasm of back: Secondary | ICD-10-CM

## 2022-07-28 MED ORDER — AMLODIPINE BESYLATE 2.5 MG PO TABS: 2.5000 mg | ORAL_TABLET | Freq: Every day | ORAL | 3 refills | Status: DC

## 2022-07-28 MED ORDER — METHOCARBAMOL 750 MG PO TABS: ORAL_TABLET | ORAL | 0 refills | Status: DC

## 2022-07-28 MED ORDER — MELOXICAM 7.5 MG PO TABS: 7.5000 mg | ORAL_TABLET | Freq: Every day | ORAL | 0 refills | Status: DC

## 2022-07-28 NOTE — Patient Instructions (Addendum)
Htn- bp bordeline elevated. Want bp to be closer to 130/80 or better. Adding amlodipine 2.5 mg daily tab to current regimen.  Diabetes- continue metfomrin. Get cmp and A1c today.  High cholesterol- continue atorvastatin. Get lipid panel today.  For low back pain rx meloxicam if needed but try to rely more on tylenol. Refill robaxin as well. Rx advisement given  Follow up in 3 months or sooner if needed.

## 2022-07-28 NOTE — Progress Notes (Signed)
Subjective:    Patient ID: Janice Jacobs, female    DOB: 1960/12/01, 62 y.o.   MRN: [id number]  HPI  Pt in for follow up.  Last seen in October. She notes working long hours recently.   Htn- lisinopril 40 mg q day and Hctz 25 mg daily. Bp is high initially today.   Diabetes- last A1c 6.3. pt still on metformin.  Hyperlipidemia- pt is on atorvastatin.   Pt is still smoking 1/2 pack a day. Pt ct lung cancer screening done 12-17-2021.    Pt has some intermittent low back pain. Worse with cold with cold weather and rain. She has uses robaxin      Review of Systems  Constitutional:  Negative for chills, fatigue and fever.  Respiratory:  Negative for cough, chest tightness, shortness of breath and wheezing.   Cardiovascular:  Negative for chest pain and palpitations.  Gastrointestinal:  Negative for abdominal pain, diarrhea and nausea.  Musculoskeletal:  Positive for back pain. Negative for joint swelling.  Skin:  Negative for rash.  Neurological:  Negative for syncope, facial asymmetry, weakness, light-headedness and numbness.  Hematological:  Negative for adenopathy. Does not bruise/bleed easily.  Psychiatric/Behavioral:  Negative for behavioral problems, confusion and decreased concentration.     Past Medical History:  Diagnosis Date   Arthritis    Blood transfusion without reported diagnosis    Family hx-breast malignancy    Hyperlipidemia    Hypertension    Scoliosis of lumbar region due to degenerative disease of spine in adult    Smoking 1/2 pack a day or less 04/12/2019   Vitamin D deficiency 05/10/2019     Social History   Socioeconomic History   Marital status: Divorced    Spouse name: Not on file   Number of children: Not on file   Years of education: Not on file   Highest education level: Not on file  Occupational History   Not on file  Tobacco Use   Smoking status: Every Day    Packs/day: 0.50    Types: Cigarettes   Smokeless tobacco: Never   Vaping Use   Vaping Use: Never used  Substance and Sexual Activity   Alcohol use: No   Drug use: No   Sexual activity: Never  Other Topics Concern   Not on file  Social History Narrative   Social Documentation   Social Determinants of Health   Financial Resource Strain: Not on file  Food Insecurity: Not on file  Transportation Needs: Not on file  Physical Activity: Not on file  Stress: Not on file  Social Connections: Not on file  Intimate Partner Violence: Not on file    Past Surgical History:  Procedure Laterality Date   ABDOMINAL HYSTERECTOMY      Family History  Problem Relation Age of Onset   Colon cancer Neg Hx    Colon polyps Neg Hx    Esophageal cancer Neg Hx    Rectal cancer Neg Hx    Stomach cancer Neg Hx     No Known Allergies  Current Outpatient Medications on File Prior to Visit  Medication Sig Dispense Refill   albuterol (VENTOLIN HFA) 108 (90 Base) MCG/ACT inhaler INHALE 2 PUFFS INTO THE LUNGS EVERY 6 HOURS AS NEEDED 18 g 5   atorvastatin (LIPITOR) 40 MG tablet Take 1 tablet (40 mg total) by mouth daily. 90 tablet 3   Blood Glucose Monitoring Suppl (ONE TOUCH ULTRA 2) w/Device KIT Use to test blood sugar once  a day.  Dx code: E11.9 1 kit 0   fluticasone (FLONASE) 50 MCG/ACT nasal spray Place 2 sprays into both nostrils daily. 16 g 1   glucose blood (ONETOUCH ULTRA) test strip Use to test blood sugar once a day.  Dx code: E11.9 100 each 1   hydrochlorothiazide (HYDRODIURIL) 25 MG tablet Take 1 tablet (25 mg total) by mouth daily. 30 tablet 0   hydrochlorothiazide (HYDRODIURIL) 25 MG tablet TAKE 1 TABLET(25 MG) BY MOUTH DAILY 90 tablet 1   HYDROcodone bit-homatropine (HYCODAN) 5-1.5 MG/5ML syrup Take 5 mLs by mouth every 8 (eight) hours as needed for cough. 120 mL 0   imiquimod (ALDARA) 5 % cream Apply topically 3 (three) times a week. Apply until total clearance or maximum of 16 weeks 24 each 5   lisinopril (ZESTRIL) 40 MG tablet TAKE 1 TABLET(40 MG)  BY MOUTH DAILY 90 tablet 1   metFORMIN (GLUCOPHAGE) 500 MG tablet TAKE 1 TABLET(500 MG) BY MOUTH DAILY WITH BREAKFAST 90 tablet 1   methocarbamol (ROBAXIN) 750 MG tablet TAKE 1 TABLET(750 MG) BY MOUTH EVERY 8 HOURS AS NEEDED FOR MUSCLE SPASMS 10 tablet 0   OneTouch Delica Lancets 99991111 MISC Use to test blood sugar once a day.  Dx code: E11.9 100 each 1   No current facility-administered medications on file prior to visit.    BP (!) 150/80   Pulse 80   Resp 18   Ht 5' (1.524 m)   Wt 184 lb (83.5 kg)   SpO2 99%   BMI 35.94 kg/m        Objective:   Physical Exam   General Mental Status- Alert. General Appearance- Not in acute distress.   Skin General: Color- Normal Color. Moisture- Normal Moisture.  Neck Carotid Arteries- Normal color. Moisture- Normal Moisture. No carotid bruits. No JVD.  Chest and Lung Exam Auscultation: Breath Sounds:-Normal.  Cardiovascular Auscultation:Rythm- Regular. Murmurs & Other Heart Sounds:Auscultation of the heart reveals- No Murmurs.  Abdomen Inspection:-Inspeection Normal. Palpation/Percussion:Note:No mass. Palpation and Percussion of the abdomen reveal- Non Tender, Non Distended + BS, no rebound or guarding.    Neurologic Cranial Nerve exam:- CN III-XII intact(No nystagmus), symmetric smile. Strength:- 5/5 equal and symmetric strength both upper and lower extremities.      Assessment & Plan:   Patient Instructions  Htn- bp bordeline elevated. Want bp to be closer to 130/80 or better. Adding amlodipine 2.5 mg daily tab to currnent regimen.  Diabetes- continue metfomrin. Get cmp and A1c today.  High cholesterol- continue atorvastatin. Get lipid panel today.  For low back pain rx meloxicam if needed but try to rely more on tylenol. Refill robaxin as well.  Follow up in 3 months or sooner if needed.    Mackie Pai, PA-C

## 2022-07-29 LAB — COMPREHENSIVE METABOLIC PANEL
ALT: 8 U/L (ref 0–35)
AST: 12 U/L (ref 0–37)
Albumin: 4 g/dL (ref 3.5–5.2)
Alkaline Phosphatase: 92 U/L (ref 39–117)
BUN: 15 mg/dL (ref 6–23)
CO2: 28 mEq/L (ref 19–32)
Calcium: 9.2 mg/dL (ref 8.4–10.5)
Chloride: 105 mEq/L (ref 96–112)
Creatinine, Ser: 0.91 mg/dL (ref 0.40–1.20)
GFR: 69.18 mL/min (ref >60.00)
Glucose, Bld: 98 mg/dL (ref 70–99)
Potassium: 4.2 mEq/L (ref 3.5–5.1)
Sodium: 140 mEq/L (ref 135–145)
Total Bilirubin: 0.2 mg/dL (ref 0.2–1.2)
Total Protein: 6.7 g/dL (ref 6.0–8.3)

## 2022-07-29 LAB — LIPID PANEL
Cholesterol: 197 mg/dL (ref 0–200)
HDL: 32.4 mg/dL — ABNORMAL LOW (ref >39.00)
NonHDL: 164.31
Total CHOL/HDL Ratio: 6
Triglycerides: 220 mg/dL — ABNORMAL HIGH (ref 0.0–149.0)
VLDL: 44 mg/dL — ABNORMAL HIGH (ref 0.0–40.0)

## 2022-07-29 LAB — LDL CHOLESTEROL, DIRECT: Direct LDL: 136 mg/dL

## 2022-07-29 LAB — HEMOGLOBIN A1C: Hgb A1c MFr Bld: 6.2 % (ref 4.6–6.5)

## 2022-08-08 ENCOUNTER — Other Ambulatory Visit: Payer: Self-pay | Admitting: Medical

## 2022-08-22 ENCOUNTER — Other Ambulatory Visit: Payer: Self-pay | Admitting: Medical

## 2022-08-22 DIAGNOSIS — M62830 Muscle spasm of back: Secondary | ICD-10-CM

## 2022-08-31 ENCOUNTER — Telehealth: Payer: Self-pay

## 2022-08-31 NOTE — Telephone Encounter (Signed)
Initial Comment Caller states that she has ringworm on her arm and it came from his grandson. Translation No Nurse Assessment Nurse: Marcello Moores, RN, Noah Delaine Date/Time Eilene Ghazi Time): 08/29/2022 1:29:14 PM Confirm and document reason for call. If symptomatic, describe symptoms. ---Caller states that she has ringworm on her arm and it came from his grandson. Her grandson was recently diagnosed with ringworm. Her ringworm is about quarter size and is not itching. Does the patient have any new or worsening symptoms? ---Yes Will a triage be completed? ---Yes Related visit to physician within the last 2 weeks? ---No Does the PT have any chronic conditions? (i.e. diabetes, asthma, this includes High risk factors for pregnancy, etc.) ---Yes List chronic conditions. ---diabetes Is this a behavioral health or substance abuse call? ---No Guidelines Guideline Title Affirmed Question Affirmed Notes Nurse Date/Time Eilene Ghazi Time) Ringworm Ringworm Lorre Munroe 08/29/2022 1:30:43 PM Disp. Time Eilene Ghazi Time) Disposition Final User 08/29/2022 12:56:43 PM Attempt made - message left Lorre Munroe 08/29/2022 1:19:13 PM FINAL ATTEMPT MADE - message left Marcello Moores, RN, Noah Delaine PLEASE NOTE: All timestamps contained within this report are represented as [place] Standard Time. CONFIDENTIALTY NOTICE: This fax transmission is intended only for the addressee. It contains information that is legally privileged, confidential or otherwise protected from use or disclosure. If you are not the intended recipient, you are strictly prohibited from reviewing, disclosing, copying using or disseminating any of this information or taking any action in reliance on or regarding this information. If you have received this fax in error, please notify us immediately by telephone so that we can arrange for its return to [place]. Phone: [phone], Toll-Free: [phone], Fax: [phone] Page: 2 of 2 Call Id:  [id number] [place]. Time Eilene Ghazi Time) Disposition Final User 08/29/2022 1:19:20 PM Send to RN Final Attempt Para March, RN, Noah Delaine 08/29/2022 1:28:01 PM Send To Call Back Waiting For Nurse Judith Blonder 08/29/2022 1:34:01 [place], RN, Noah Delaine Final Disposition 08/29/2022 1:34:01 [place], RN, Lenice Llamas Disagree/Comply Comply Caller Understands Yes PreDisposition Did not know what to do Care Advice Given Per Guideline HOME CARE: * You should be able to treat this at home. CARE ADVICE given per Ringworm (Adult) guideline. CALL BACK IF: * Rash continues to spread after 1 week on treatment * Rash has not gone away after 3 weeks * You become worse EXPECTED COURSE: * Ringworm should start getting better within 1 week. REASSURANCE AND EDUCATION - RINGWORM: * It sounds like ringworm and we can easily treat that at home. RINGWORM - TREATMENT WITH ANTIFUNGAL CREAM * There are a number of OVER-THE-COUNTER ANTIFUNGAL CREAMS for the treatment of ringworm. You can get this at the drugstore. * Put the antifungal cream on the area of itching and rash 2 times a day. Put it on the rash and one inch (2 to 3 cm) beyond its borders. * Continue the cream for at last 7 days after the rash has cleared. * Available in [place]: Clotrimazole (clotrimazole cream, Canesten, Clotrimaderm) or miconazole (Micatin Cream, Micozole, Monistat-Derm)

## 2022-10-03 ENCOUNTER — Other Ambulatory Visit: Payer: Self-pay | Admitting: Medical

## 2022-10-03 DIAGNOSIS — M62830 Muscle spasm of back: Secondary | ICD-10-CM

## 2022-10-05 MED ORDER — METHOCARBAMOL 750 MG PO TABS: ORAL_TABLET | ORAL | 0 refills | Status: DC

## 2022-10-06 ENCOUNTER — Other Ambulatory Visit: Payer: Self-pay | Admitting: Medical

## 2022-10-23 ENCOUNTER — Telehealth: Payer: Self-pay | Admitting: Medical

## 2022-10-23 DIAGNOSIS — M62830 Muscle spasm of back: Secondary | ICD-10-CM

## 2022-10-23 MED ORDER — LISINOPRIL 40 MG PO TABS: ORAL_TABLET | ORAL | 1 refills | Status: DC

## 2022-10-23 MED ORDER — METHOCARBAMOL 750 MG PO TABS: ORAL_TABLET | ORAL | 0 refills | Status: DC

## 2022-10-23 NOTE — Telephone Encounter (Signed)
Rx sent 

## 2022-10-23 NOTE — Telephone Encounter (Signed)
Prescription Request  10/23/2022  Is this a "Controlled Substance" medicine? Yes  LOV: 07/28/2022  What is the name of the medication or equipment?  1.methocarbamol (ROBAXIN) 750 MG tablet   Takes 3 a day ( needs a bigger quantity) 2.lisinopril (ZESTRIL) 40 MG tablet    Have you contacted your pharmacy to request a refill? No   Which pharmacy would you like this sent to?  [id number] - [place], [place] - [address] AT [facility] OF [address] [place] [address] Phone: [phone] Fax: [phone]   Patient notified that their request is being sent to the clinical staff for review and that they should receive a response within 2 business days.   Please advise at [facility] [phone]

## 2022-11-13 ENCOUNTER — Other Ambulatory Visit: Payer: Self-pay | Admitting: Acute Care

## 2022-11-13 DIAGNOSIS — F17210 Nicotine dependence, cigarettes, uncomplicated: Secondary | ICD-10-CM

## 2022-11-13 DIAGNOSIS — Z87891 Personal history of nicotine dependence: Secondary | ICD-10-CM

## 2022-11-13 DIAGNOSIS — Z122 Encounter for screening for malignant neoplasm of respiratory organs: Secondary | ICD-10-CM

## 2022-11-24 ENCOUNTER — Other Ambulatory Visit: Payer: Self-pay | Admitting: Medical

## 2022-11-24 DIAGNOSIS — M62830 Muscle spasm of back: Secondary | ICD-10-CM

## 2022-11-26 NOTE — Telephone Encounter (Signed)
Sent in refill. Pt overdue for follow up for diabetes. Please get her scheduled.

## 2022-12-18 ENCOUNTER — Ambulatory Visit (HOSPITAL_BASED_OUTPATIENT_CLINIC_OR_DEPARTMENT_OTHER): Payer: No Typology Code available for payment source | Attending: Acute Care

## 2022-12-23 ENCOUNTER — Ambulatory Visit: Payer: No Typology Code available for payment source | Admitting: Medical

## 2022-12-30 ENCOUNTER — Ambulatory Visit: Payer: No Typology Code available for payment source | Admitting: Medical

## 2023-01-04 ENCOUNTER — Other Ambulatory Visit: Payer: Self-pay | Admitting: Medical

## 2023-01-04 DIAGNOSIS — M62830 Muscle spasm of back: Secondary | ICD-10-CM

## 2023-01-06 ENCOUNTER — Ambulatory Visit: Payer: Self-pay | Admitting: Medical

## 2023-01-13 ENCOUNTER — Other Ambulatory Visit: Payer: Self-pay | Admitting: Medical

## 2023-01-13 ENCOUNTER — Telehealth: Payer: Self-pay | Admitting: Medical

## 2023-01-13 DIAGNOSIS — E119 Type 2 diabetes mellitus without complications: Secondary | ICD-10-CM

## 2023-01-13 MED ORDER — METFORMIN HCL 500 MG PO TABS
ORAL_TABLET | ORAL | 1 refills | Status: DC
Start: 2023-01-13 — End: 2023-02-26

## 2023-01-13 NOTE — Addendum Note (Signed)
Addended by: Maximino Sarin on: 01/13/2023 03:56 PM   Modules accepted: Orders

## 2023-01-13 NOTE — Telephone Encounter (Signed)
Rx sent 

## 2023-01-13 NOTE — Telephone Encounter (Signed)
Prescription Request  01/13/2023  Is this a "Controlled Substance" medicine? No  LOV: 07/28/2022  What is the name of the medication or equipment? metFORMIN (GLUCOPHAGE) 500 MG tablet   Have you contacted your pharmacy to request a refill? No   Which pharmacy would you like this sent to?  [facility][id number] - [place], [place] - [address] AT [facility] OF [address] [place] [address] Phone: [phone] Fax: [phone]   Patient notified that their request is being sent to the clinical staff for review and that they should receive a response within 2 business days.   Please advise at [facility] [phone]

## 2023-02-01 ENCOUNTER — Telehealth: Payer: Self-pay | Admitting: Medical

## 2023-02-01 NOTE — Telephone Encounter (Signed)
Pt [facility] [address] [address] going forward.   Pt also said she has an UTI and the OTC meds hasn't helped at all and now she is spotting. Pt wants to know if an antibiotic can be called in for it. Pt was advised she would need appt but says she knows what it is and cannot come in until tomorrow.

## 2023-02-02 ENCOUNTER — Ambulatory Visit (INDEPENDENT_AMBULATORY_CARE_PROVIDER_SITE_OTHER): Payer: 59 | Admitting: Family Medicine

## 2023-02-02 VITALS — BP 135/69 | HR 72 | Ht 60.0 in | Wt 165.8 lb

## 2023-02-02 DIAGNOSIS — I10 Essential (primary) hypertension: Secondary | ICD-10-CM

## 2023-02-02 DIAGNOSIS — R300 Dysuria: Secondary | ICD-10-CM

## 2023-02-02 DIAGNOSIS — R3915 Urgency of urination: Secondary | ICD-10-CM | POA: Diagnosis not present

## 2023-02-02 DIAGNOSIS — R3129 Other microscopic hematuria: Secondary | ICD-10-CM

## 2023-02-02 LAB — POC URINALSYSI DIPSTICK (AUTOMATED)
Bilirubin, UA: NEGATIVE
Glucose, UA: NEGATIVE
Ketones, UA: NEGATIVE
Nitrite, UA: NEGATIVE
Protein, UA: POSITIVE — AB
Spec Grav, UA: 1.015 (ref 1.010–1.025)
Urobilinogen, UA: 0.2 E.U./dL
pH, UA: 5 (ref 5.0–8.0)

## 2023-02-02 LAB — URINALYSIS, MICROSCOPIC ONLY

## 2023-02-02 MED ORDER — LISINOPRIL 40 MG PO TABS
ORAL_TABLET | ORAL | 0 refills | Status: DC
Start: 2023-02-02 — End: 2023-02-26

## 2023-02-02 MED ORDER — NITROFURANTOIN MONOHYD MACRO 100 MG PO CAPS
100.0000 mg | ORAL_CAPSULE | Freq: Two times a day (BID) | ORAL | 0 refills | Status: AC
Start: 2023-02-02 — End: 2023-02-07

## 2023-02-02 MED ORDER — PHENAZOPYRIDINE HCL 200 MG PO TABS: 200.0000 mg | ORAL_TABLET | Freq: Three times a day (TID) | ORAL | 0 refills | Status: DC | PRN

## 2023-02-02 NOTE — Telephone Encounter (Signed)
Patient evaluated/treated by Hyman Hopes, NP today.  Refill sent in by provider.

## 2023-02-02 NOTE — Progress Notes (Signed)
Acute Office Visit  Subjective:     Patient ID: Janice Jacobs, female    DOB: Feb 06, 1961, 62 y.o.   MRN: [id number]  CC: UTI symptoms   HPI Patient is in today for UTI symptoms.   Discussed the use of AI scribe software for clinical note transcription with the patient, who gave verbal consent to proceed.  History of Present Illness   The patient, with a history of diabetes, presents with urinary symptoms that started a few days ago. She describes a sensation of bladder pressure and increased urinary frequency. Notably, she observed blood in her urine starting the day before the consultation. She denies any associated burning sensation during urination.  The patient also reports mid discomfort in both sides of her lower back history of chronic low back pain, at baseline). However, she denies any systemic symptoms such as fevers, chills, body aches, nausea, vomiting, or diarrhea. Prior to the onset of these urinary symptoms, the patient experienced vaginal symptoms, which she self-treated with over-the-counter medication.  The patient has been managing her symptoms with Azo and increased fluid intake, including cranberry juice and water, but reports minimal relief. She has a history of urinary tract infections, with the most recent episode occurring over a year ago.             ROS All review of systems negative except what is listed in the HPI      Objective:    BP 135/69   Pulse 72   Ht 5' (1.524 m)   Wt 165 lb 12.8 oz (75.2 kg)   BMI 32.38 kg/m    Physical Exam Vitals reviewed.  Constitutional:      Appearance: Normal appearance.  Cardiovascular:     Rate and Rhythm: Normal rate and regular rhythm.     Heart sounds: Normal heart sounds.  Pulmonary:     Effort: Pulmonary effort is normal.     Breath sounds: Normal breath sounds.  Abdominal:     Tenderness: There is no abdominal tenderness. There is no right CVA tenderness, left CVA tenderness or guarding.   Skin:    General: Skin is warm and dry.  Neurological:     Mental Status: She is alert and oriented to person, place, and time.  Psychiatric:        Mood and Affect: Mood normal.        Behavior: Behavior normal.        Thought Content: Thought content normal.        Judgment: Judgment normal.     Results for orders placed or performed in visit on 02/02/23  POCT Urinalysis Dipstick (Automated)  Result Value Ref Range   Color, UA dark yellow    Clarity, UA cloudy    Glucose, UA Negative Negative   Bilirubin, UA neg    Ketones, UA neg    Spec Grav, UA 1.015 1.010 - 1.025   Blood, UA moderate    pH, UA 5.0 5.0 - 8.0   Protein, UA Positive (A) Negative   Urobilinogen, UA 0.2 0.2 or 1.0 E.U./dL   Nitrite, UA neg    Leukocytes, UA Large (3+) (A) Negative        Assessment & Plan:   Problem List Items Addressed This Visit     Essential hypertension   Relevant Medications   lisinopril (ZESTRIL) 40 MG tablet   Other Visit Diagnoses     Dysuria    -  Primary   Relevant Medications  phenazopyridine (PYRIDIUM) 200 MG tablet   nitrofurantoin, macrocrystal-monohydrate, (MACROBID) 100 MG capsule   Other Relevant Orders   POCT Urinalysis Dipstick (Automated) (Completed)   Urine Culture   Urinary urgency       Relevant Orders   POCT Urinalysis Dipstick (Automated) (Completed)   Urine Culture   Other microscopic hematuria       Relevant Orders   Urine Microscopic Only       Urinary Tract Infection Symptoms of urinary frequency, urgency, and hematuria. Urinalysis shows leukocytes and blood. No fever, chills, or flank pain. -Start Macrobid pending urine culture results. Adding micro for hematuria.  -Continue symptomatic treatment with Azo. -Add Pyridium for bladder spasms as needed.     Meds ordered this encounter  Medications   lisinopril (ZESTRIL) 40 MG tablet    Sig: TAKE 1 TABLET(40 MG) BY MOUTH DAILY    Dispense:  90 tablet    Refill:  0    Order Specific  Question:   Supervising Provider    Answer:   Danise Edge A [[id number]]   phenazopyridine (PYRIDIUM) 200 MG tablet    Sig: Take 1 tablet (200 mg total) by mouth 3 (three) times daily as needed for pain.    Dispense:  10 tablet    Refill:  0    Order Specific Question:   Supervising Provider    Answer:   Danise Edge A [[id number]]   nitrofurantoin, macrocrystal-monohydrate, (MACROBID) 100 MG capsule    Sig: Take 1 capsule (100 mg total) by mouth 2 (two) times daily for 5 days.    Dispense:  10 capsule    Refill:  0    Order Specific Question:   Supervising Provider    Answer:   Danise Edge A [[id number]]    Return if symptoms worsen or fail to improve.  Clayborne Dana, NP

## 2023-02-05 LAB — URINE CULTURE
MICRO NUMBER:: 15355544
SPECIMEN QUALITY:: ADEQUATE

## 2023-02-05 MED ORDER — CEPHALEXIN 500 MG PO CAPS
500.0000 mg | ORAL_CAPSULE | Freq: Two times a day (BID) | ORAL | 0 refills | Status: AC
Start: 2023-02-05 — End: 2023-02-12

## 2023-02-05 NOTE — Addendum Note (Signed)
Addended by: Hyman Hopes B on: 02/05/2023 01:15 PM   Modules accepted: Orders

## 2023-02-09 ENCOUNTER — Telehealth: Payer: 59 | Admitting: Medical

## 2023-02-10 ENCOUNTER — Ambulatory Visit: Payer: 59 | Admitting: Medical

## 2023-02-10 ENCOUNTER — Telehealth: Payer: Self-pay

## 2023-02-10 NOTE — Telephone Encounter (Signed)
No show letter created.

## 2023-02-25 ENCOUNTER — Other Ambulatory Visit: Payer: Self-pay | Admitting: Medical

## 2023-02-25 DIAGNOSIS — I10 Essential (primary) hypertension: Secondary | ICD-10-CM

## 2023-02-26 ENCOUNTER — Telehealth: Payer: Self-pay | Admitting: Medical

## 2023-02-26 DIAGNOSIS — I10 Essential (primary) hypertension: Secondary | ICD-10-CM

## 2023-02-26 DIAGNOSIS — E119 Type 2 diabetes mellitus without complications: Secondary | ICD-10-CM

## 2023-02-26 DIAGNOSIS — M62830 Muscle spasm of back: Secondary | ICD-10-CM

## 2023-02-26 MED ORDER — METFORMIN HCL 500 MG PO TABS: ORAL_TABLET | ORAL | 1 refills | Status: DC

## 2023-02-26 MED ORDER — AMLODIPINE BESYLATE 2.5 MG PO TABS: 2.5000 mg | ORAL_TABLET | Freq: Every day | ORAL | 3 refills | Status: DC

## 2023-02-26 MED ORDER — LISINOPRIL 40 MG PO TABS
ORAL_TABLET | ORAL | 0 refills | Status: DC
Start: 2023-02-26 — End: 2023-08-05

## 2023-02-26 MED ORDER — MELOXICAM 7.5 MG PO TABS: 7.5000 mg | ORAL_TABLET | Freq: Every day | ORAL | 0 refills | Status: DC

## 2023-02-26 MED ORDER — METHOCARBAMOL 750 MG PO TABS
ORAL_TABLET | ORAL | 0 refills | Status: DC
Start: 2023-02-26 — End: 2023-03-10

## 2023-02-26 MED ORDER — HYDROCHLOROTHIAZIDE 25 MG PO TABS: 25.0000 mg | ORAL_TABLET | Freq: Every day | ORAL | 1 refills | Status: DC

## 2023-02-26 NOTE — Telephone Encounter (Signed)
Rx sent 

## 2023-02-26 NOTE — Telephone Encounter (Signed)
**  Pt would like ALL medications to go the following pharmacy until further notice. Pt also wanted to look into alternative test strips for her glucometer as she stated her insurance will not cover them.**   Prescription Request  02/26/2023  Is this a "Controlled Substance" medicine? No  LOV: Visit date not found  What is the name of the medication or equipment?   "All of them" (Pt was unable to name medications)  Have you contacted your pharmacy to request a refill? Yes   Which pharmacy would you like this sent to?   [id number] [place], [place] - [address] [place] [address] Phone: [phone] Fax: [phone]    Patient notified that their request is being sent to the clinical staff for review and that they should receive a response within 2 business days.   Please advise at Mobile [phone] (mobile)

## 2023-02-26 NOTE — Addendum Note (Signed)
Addended by: Maximino Sarin on: 02/26/2023 10:25 AM   Modules accepted: Orders

## 2023-03-09 ENCOUNTER — Telehealth: Payer: 59 | Admitting: Medical

## 2023-03-10 ENCOUNTER — Ambulatory Visit: Payer: 59 | Admitting: Medical

## 2023-03-10 ENCOUNTER — Encounter: Payer: Self-pay | Admitting: Medical

## 2023-03-10 ENCOUNTER — Ambulatory Visit: Payer: 59 | Admitting: Family Medicine

## 2023-03-10 VITALS — BP 120/88 | HR 92 | Temp 98.9°F | Resp 18 | Ht 60.0 in | Wt 168.6 lb

## 2023-03-10 DIAGNOSIS — I10 Essential (primary) hypertension: Secondary | ICD-10-CM | POA: Diagnosis not present

## 2023-03-10 DIAGNOSIS — Z7984 Long term (current) use of oral hypoglycemic drugs: Secondary | ICD-10-CM

## 2023-03-10 DIAGNOSIS — R944 Abnormal results of kidney function studies: Secondary | ICD-10-CM

## 2023-03-10 DIAGNOSIS — E785 Hyperlipidemia, unspecified: Secondary | ICD-10-CM | POA: Diagnosis not present

## 2023-03-10 DIAGNOSIS — Z87891 Personal history of nicotine dependence: Secondary | ICD-10-CM

## 2023-03-10 DIAGNOSIS — M62830 Muscle spasm of back: Secondary | ICD-10-CM

## 2023-03-10 DIAGNOSIS — E119 Type 2 diabetes mellitus without complications: Secondary | ICD-10-CM | POA: Diagnosis not present

## 2023-03-10 DIAGNOSIS — M5440 Lumbago with sciatica, unspecified side: Secondary | ICD-10-CM

## 2023-03-10 DIAGNOSIS — Z1211 Encounter for screening for malignant neoplasm of colon: Secondary | ICD-10-CM

## 2023-03-10 LAB — LIPID PANEL
Cholesterol: 228 mg/dL — ABNORMAL HIGH (ref 0–200)
HDL: 44 mg/dL (ref >39.00)
LDL Cholesterol: 158 mg/dL — ABNORMAL HIGH (ref 0–99)
NonHDL: 184.37
Total CHOL/HDL Ratio: 5
Triglycerides: 132 mg/dL (ref 0.0–149.0)
VLDL: 26.4 mg/dL (ref 0.0–40.0)

## 2023-03-10 LAB — COMPREHENSIVE METABOLIC PANEL
ALT: 6 U/L (ref 0–35)
AST: 9 U/L (ref 0–37)
Albumin: 4.3 g/dL (ref 3.5–5.2)
Alkaline Phosphatase: 77 U/L (ref 39–117)
BUN: 31 mg/dL — ABNORMAL HIGH (ref 6–23)
CO2: 29 mEq/L (ref 19–32)
Calcium: 9.2 mg/dL (ref 8.4–10.5)
Chloride: 103 mEq/L (ref 96–112)
Creatinine, Ser: 1.44 mg/dL — ABNORMAL HIGH (ref 0.40–1.20)
GFR: 39.71 mL/min — ABNORMAL LOW (ref >60.00)
Glucose, Bld: 89 mg/dL (ref 70–99)
Potassium: 4.3 mEq/L (ref 3.5–5.1)
Sodium: 139 mEq/L (ref 135–145)
Total Bilirubin: 0.3 mg/dL (ref 0.2–1.2)
Total Protein: 7 g/dL (ref 6.0–8.3)

## 2023-03-10 LAB — MICROALBUMIN / CREATININE URINE RATIO
Creatinine,U: 110.9 mg/dL
Microalb Creat Ratio: 1.7 mg/g (ref 0.0–30.0)
Microalb, Ur: 1.9 mg/dL (ref 0.0–1.9)

## 2023-03-10 LAB — HEMOGLOBIN A1C: Hgb A1c MFr Bld: 6.1 % (ref 4.6–6.5)

## 2023-03-10 MED ORDER — HYDROCHLOROTHIAZIDE 25 MG PO TABS: ORAL_TABLET | ORAL | 0 refills | Status: DC

## 2023-03-10 MED ORDER — MELOXICAM 7.5 MG PO TABS: 7.5000 mg | ORAL_TABLET | Freq: Every day | ORAL | 1 refills | Status: DC

## 2023-03-10 MED ORDER — METHOCARBAMOL 750 MG PO TABS
ORAL_TABLET | ORAL | 0 refills | Status: DC
Start: 2023-03-10 — End: 2023-03-30

## 2023-03-10 MED ORDER — ONETOUCH ULTRA VI STRP: ORAL_STRIP | 2 refills | Status: AC

## 2023-03-10 NOTE — Progress Notes (Signed)
Subjective:    Patient ID: Janice Jacobs, female    DOB: [birth year], 62 y.o.   MRN: [id number]  HPI  Pt in for follow up.  Pt update me she changed jobs. Now at [employer]. Was at [employer]. She states it is better job.   Last AVS.  "Htn- bp bordeline elevated. Want bp to be closer to 130/80 or better. Adding amlodipine 2.5 mg daily tab to currnent regimen.   Diabetes- continue metfomrin. Get cmp and A1c today.   High cholesterol- continue atorvastatin. Get lipid panel today.  Bp is still well controlled on amlodipine 2.5 mg daily and lisinopril 40 mg daily.  Diabetes- last A1c 6.2. Pt is still on metformin 500 mg daily.  Chronic low back intermittent- in past has done well with meloxicam and robaxin.  Rare intermittent pedal edema.   Purposeful weight loss since last visit. She has cut back on portion control.   Hx of smoking- advised to get ct lung cancer screening. Order is in epic.   Review of Systems  Constitutional:  Negative for chills, fatigue and fever.  Respiratory:  Negative for cough, chest tightness, shortness of breath and wheezing.   Cardiovascular:  Negative for chest pain and palpitations.  Gastrointestinal:  Negative for abdominal pain.  Musculoskeletal:  Negative for back pain and myalgias.       See hpi.  Skin:  Negative for rash.  Neurological:  Negative for dizziness, weakness, numbness and headaches.  Hematological:  Negative for adenopathy. Does not bruise/bleed easily.  Psychiatric/Behavioral:  Negative for behavioral problems and confusion.    Past Medical History:  Diagnosis Date   Arthritis    Blood transfusion without reported diagnosis    Family hx-breast malignancy    Hyperlipidemia    Hypertension    Scoliosis of lumbar region due to degenerative disease of spine in adult    Smoking 1/2 pack a day or less 04/12/2019   Vitamin D deficiency 05/10/2019     Social History   Socioeconomic History   Marital status: Divorced    Spouse  name: Not on file   Number of children: Not on file   Years of education: Not on file   Highest education level: 12th grade  Occupational History   Not on file  Tobacco Use   Smoking status: Every Day    Current packs/day: 0.50    Types: Cigarettes   Smokeless tobacco: Never  Vaping Use   Vaping status: Never Used  Substance and Sexual Activity   Alcohol use: No   Drug use: No   Sexual activity: Never  Other Topics Concern   Not on file  Social History Narrative   Social Documentation   Social Determinants of Health   Financial Resource Strain: High Risk (02/02/2023)   Overall Financial Resource Strain (CARDIA)    Difficulty of Paying Living Expenses: Hard  Food Insecurity: Food Insecurity Present (02/02/2023)   Hunger Vital Sign    Worried About Running Out of Food in the Last Year: Sometimes true    Ran Out of Food in the Last Year: Often true  Transportation Needs: No Transportation Needs (02/02/2023)   PRAPARE - Administrator, Civil Service (Medical): No    Lack of Transportation (Non-Medical): No  Physical Activity: Insufficiently Active (02/02/2023)   Exercise Vital Sign    Days of Exercise per Week: 7 days    Minutes of Exercise per Session: 20 min  Stress: No Stress Concern Present (02/02/2023)  [employer] of Occupational Health - Occupational Stress Questionnaire    Feeling of Stress : Only a little  Social Connections: Moderately Isolated (02/02/2023)   Social Connection and Isolation Panel [NHANES]    Frequency of Communication with Friends and Family: Once a week    Frequency of Social Gatherings with Friends and Family: Once a week    Attends Religious Services: More than 4 times per year    Active Member of [employer] or Organizations: Yes    Attends Engineer, structural: More than 4 times per year    Marital Status: Divorced  Catering manager Violence: Not on file    Past Surgical History:  Procedure Laterality Date   ABDOMINAL  HYSTERECTOMY      Family History  Problem Relation Age of Onset   Colon cancer Neg Hx    Colon polyps Neg Hx    Esophageal cancer Neg Hx    Rectal cancer Neg Hx    Stomach cancer Neg Hx     No Known Allergies  Current Outpatient Medications on File Prior to Visit  Medication Sig Dispense Refill   albuterol (VENTOLIN HFA) 108 (90 Base) MCG/ACT inhaler INHALE 2 PUFFS INTO THE LUNGS EVERY 6 HOURS AS NEEDED 18 g 5   amLODipine (NORVASC) 2.5 MG tablet Take 1 tablet (2.5 mg total) by mouth daily. 90 tablet 3   Blood Glucose Monitoring Suppl (ONE TOUCH ULTRA 2) w/Device KIT Use to test blood sugar once a day.  Dx code: E11.9 1 kit 0   fluticasone (FLONASE) 50 MCG/ACT nasal spray Place 2 sprays into both nostrils daily. 16 g 1   imiquimod (ALDARA) 5 % cream Apply topically 3 (three) times a week. Apply until total clearance or maximum of 16 weeks 24 each 5   lisinopril (ZESTRIL) 40 MG tablet TAKE 1 TABLET(40 MG) BY MOUTH DAILY 90 tablet 0   metFORMIN (GLUCOPHAGE) 500 MG tablet TAKE 1 TABLET(500 MG) BY MOUTH DAILY WITH BREAKFAST 90 tablet 1   OneTouch Delica Lancets 33G MISC Use to test blood sugar once a day.  Dx code: E11.9 100 each 1   phenazopyridine (PYRIDIUM) 200 MG tablet Take 1 tablet (200 mg total) by mouth 3 (three) times daily as needed for pain. 10 tablet 0   atorvastatin (LIPITOR) 40 MG tablet Take 1 tablet (40 mg total) by mouth daily. 90 tablet 3   No current facility-administered medications on file prior to visit.    BP 120/88 (BP Location: Right Arm, Patient Position: Sitting, Cuff Size: Normal)   Pulse 92   Temp 98.9 F (37.2 C) (Oral)   Resp 18   Ht 5' (1.524 m)   Wt 168 lb 9.6 oz (76.5 kg)   SpO2 99%   BMI 32.93 kg/m        Objective:   Physical Exam  General Mental Status- Alert. General Appearance- Not in acute distress.   Skin General: Color- Normal Color. Moisture- Normal Moisture.  Neck Carotid Arteries- Normal color. Moisture- Normal Moisture. No  carotid bruits. No JVD.  Chest and Lung Exam Auscultation: Breath Sounds:-Normal.  Cardiovascular Auscultation:Rythm- Regular. Murmurs & Other Heart Sounds:Auscultation of the heart reveals- No Murmurs.  Abdomen Inspection:-Inspeection Normal. Palpation/Percussion:Note:No mass. Palpation and Percussion of the abdomen reveal- Non Tender, Non Distended + BS, no rebound or guarding.    Neurologic Cranial Nerve exam:- CN III-XII intact(No nystagmus), symmetric smile. Strength:- 5/5 equal and symmetric strength both upper and lower extremities.   Lower ext- no pedal edema.  Calfs symmetric. Negative homans sign.    Assessment & Plan:   Patient Instructions  1. Paraspinal muscle spasm - methocarbamol (ROBAXIN) 750 MG tablet; TAKE 1 TABLET BY MOUTH THREE TIMES DAILY AS NEEDED FOR BACK PAIN OR MUSCLE SPASMS  Dispense: 40 tablet; Refill: 0  2. Bilateral low back pain with sciatica, sciatica laterality unspecified, unspecified chronicity - continue meloxicam when needed as dicussed  3. Type 2 diabetes mellitus without complication, without long-term current use of insulin (HCC) -continue metformin and low sugar diet. - Hemoglobin A1c  4. Hypertension, unspecified type - bp well controlled with lisinopril and amlodipine - Comp Met (CMET)  5. Hyperlipidemia, unspecified hyperlipidemia type -On atorvastatin. Check lipid panel - Comp Met (CMET) - Lipid panel  6. History of smoking Glad to hear you cut back to 5 cigarrets a day.  Try to quit completely if possible. Please get ct screening lung cancer. Order in epic. Check down stairs radiology to get scheduled.   Placed gi referral for repeat colonoscopy. Please call gi office.  Can schedule  flu vaccine as nurse visit since declined today. Also consider shingrix.  Follow up date to be determined after lab review.      Esperanza Richters, PA-C

## 2023-03-10 NOTE — Patient Instructions (Addendum)
1. Paraspinal muscle spasm - methocarbamol (ROBAXIN) 750 MG tablet; TAKE 1 TABLET BY MOUTH THREE TIMES DAILY AS NEEDED FOR BACK PAIN OR MUSCLE SPASMS  Dispense: 40 tablet; Refill: 0  2. Bilateral low back pain with sciatica, sciatica laterality unspecified, unspecified chronicity - continue meloxicam when needed as dicussed  3. Type 2 diabetes mellitus without complication, without long-term current use of insulin (HCC) -continue metformin and low sugar diet. - Hemoglobin A1c  4. Hypertension, unspecified type - bp well controlled with lisinopril and amlodipine - Comp Met (CMET)  5. Hyperlipidemia, unspecified hyperlipidemia type -On atorvastatin. Check lipid panel - Comp Met (CMET) - Lipid panel  6. History of smoking Glad to hear you cut back to 5 cigarrets a day.  Try to quit completely if possible. Please get ct screening lung cancer. Order in epic. Check down stairs radiology to get scheduled.  Pedal edema intermittent- rx hydrochlorothiazide for rare use as we discussed it needed  Placed gi referral for repeat colonoscopy. Please call gi office.  Can schedule  flu vaccine as nurse visit since declined today. Also consider shingrix.  Follow up date to be determined after lab review.

## 2023-03-10 NOTE — Addendum Note (Signed)
Addended by: Gwenevere Abbot on: 03/10/2023 07:52 PM   Modules accepted: Orders

## 2023-03-15 ENCOUNTER — Telehealth: Payer: Self-pay | Admitting: *Deleted

## 2023-03-15 NOTE — Telephone Encounter (Signed)
Left VM for pt to call back to reschedule lung screening CT at a [facility] location. Pt's insurance will not approve [facility] for the CT.

## 2023-03-16 ENCOUNTER — Other Ambulatory Visit: Payer: 59

## 2023-03-16 ENCOUNTER — Ambulatory Visit (HOSPITAL_BASED_OUTPATIENT_CLINIC_OR_DEPARTMENT_OTHER): Admission: RE | Admit: 2023-03-16 | Payer: 59 | Source: Ambulatory Visit

## 2023-03-19 ENCOUNTER — Telehealth (HOSPITAL_BASED_OUTPATIENT_CLINIC_OR_DEPARTMENT_OTHER): Payer: Self-pay

## 2023-03-24 ENCOUNTER — Other Ambulatory Visit: Payer: 59

## 2023-03-28 ENCOUNTER — Other Ambulatory Visit: Payer: Self-pay | Admitting: Medical

## 2023-03-28 DIAGNOSIS — M62830 Muscle spasm of back: Secondary | ICD-10-CM

## 2023-03-29 ENCOUNTER — Telehealth: Payer: Self-pay

## 2023-03-29 DIAGNOSIS — M62830 Muscle spasm of back: Secondary | ICD-10-CM

## 2023-03-29 NOTE — Telephone Encounter (Signed)
Initial Comment Caller states that she needs Rx MethocarbamolShe is out and her back is hurting do to weather change. Translation No Nurse Assessment Nurse: Lalla Brothers, RN, April Date/Time Lamount Cohen Time): 03/28/2023 11:28:18 AM Confirm and document reason for call. If symptomatic, describe symptoms. ---Caller states she is out of her methocarbamol and her back pain is 9/10. Heat/ice, repositioning doesn't help and she can barely move. Pain radiates down both legs and she has muscle spasms. Denies sx of infection Does the patient have any new or worsening symptoms? ---Yes Will a triage be completed? ---Yes Related visit to physician within the last 2 weeks? ---No Does the PT have any chronic conditions? (i.e. diabetes, asthma, this includes High risk factors for pregnancy, etc.) ---Yes List chronic conditions. ---Diabetes, chronic back pain Is this a behavioral health or substance abuse call? ---No Guidelines Guideline Title Affirmed Question Affirmed Notes Nurse Date/Time (Eastern Time) Back Pain [1] Pain radiates into the thigh or further down the leg AND [2] both legs Lalla Brothers, RN, April 03/28/2023 11:30:45 AM Disp. Time Lamount Cohen Time) Disposition Final User 03/28/2023 11:02:01 AM Send To Nurse Maudry Mayhew, RN, Nicola Girt NOTE: All timestamps contained within this report are represented as [place] Standard Time. CONFIDENTIALTY NOTICE: This fax transmission is intended only for the addressee. It contains information that is legally privileged, confidential or otherwise protected from use or disclosure. If you are not the intended recipient, you are strictly prohibited from reviewing, disclosing, copying using or disseminating any of this information or taking any action in reliance on or regarding this information. If you have received this fax in error, please notify us immediately by telephone so that we can arrange for its return to [place]. Phone: [phone], Toll-Free:  [phone], Fax: [phone] Page: 2 of 2 Call Id: [id number] Disp. Time Lamount Cohen Time) Disposition Final User 03/28/2023 11:33:45 AM See HCP within 4 Hours (or PCP triage) Domingo Mend, RN, April Final Disposition 03/28/2023 11:33:45 AM See HCP within 4 Hours (or PCP triage) Yes Lalla Brothers, RN, April Caller Disagree/Comply Disagree Caller Understands Yes PreDisposition Call Doctor Care Advice Given Per Guideline SEE HCP (OR PCP TRIAGE) WITHIN 4 HOURS: * IF OFFICE WILL BE CLOSED AND PCP SECOND-LEVEL TRIAGE REQUIRED: You may need to be seen. Your doctor (or NP/PA) will want to talk with you to decide what's best. I'll page the oncall provider now. If you haven't heard from the provider (or me) within 30 minutes, call again. NOTE: If on-call provider can't be reached, send to [facility] or ED. PAIN MEDICINES: * For pain relief, you can take either acetaminophen, ibuprofen, or naproxen. * They are over-the-counter (OTC) pain drugs. You can buy them at the drugstore. CALL BACK IF: * You become worse CARE ADVICE given per Back Pain (Adult) guideline. Comments User: April, Lambert, RN Date/Time Lamount Cohen Time): 03/28/2023 11:30:03 AM Caller has appt tomorrow am. Referrals GO TO FACILITY UNDECIDED

## 2023-03-30 MED ORDER — MELOXICAM 7.5 MG PO TABS: 7.5000 mg | ORAL_TABLET | Freq: Every day | ORAL | 1 refills | Status: DC

## 2023-03-30 MED ORDER — METHOCARBAMOL 750 MG PO TABS: ORAL_TABLET | ORAL | 0 refills | Status: DC

## 2023-03-30 NOTE — Addendum Note (Signed)
Addended by: Maximino Sarin on: 03/30/2023 08:31 AM   Modules accepted: Orders

## 2023-03-30 NOTE — Telephone Encounter (Signed)
Rx sent 

## 2023-04-01 NOTE — Telephone Encounter (Signed)
Rx refill sent to pharmacy. 

## 2023-04-24 ENCOUNTER — Other Ambulatory Visit: Payer: Self-pay | Admitting: Medical

## 2023-04-24 DIAGNOSIS — M62830 Muscle spasm of back: Secondary | ICD-10-CM

## 2023-04-26 MED ORDER — METHOCARBAMOL 750 MG PO TABS: ORAL_TABLET | ORAL | 0 refills | Status: DC

## 2023-04-26 NOTE — Telephone Encounter (Signed)
Rx refill sent to pharmacy. 

## 2023-05-20 ENCOUNTER — Ambulatory Visit (INDEPENDENT_AMBULATORY_CARE_PROVIDER_SITE_OTHER): Payer: 59 | Admitting: Medical

## 2023-05-20 ENCOUNTER — Ambulatory Visit (HOSPITAL_BASED_OUTPATIENT_CLINIC_OR_DEPARTMENT_OTHER)
Admission: RE | Admit: 2023-05-20 | Discharge: 2023-05-20 | Disposition: A | Discharge status: Home / Self Care | Payer: 59 | Source: Ambulatory Visit | Attending: Medical | Admitting: Medical

## 2023-05-20 ENCOUNTER — Encounter: Payer: Self-pay | Admitting: Medical

## 2023-05-20 VITALS — BP 158/88 | HR 90 | Temp 98.8°F | Resp 16 | Ht 60.0 in | Wt 176.0 lb

## 2023-05-20 DIAGNOSIS — R059 Cough, unspecified: Secondary | ICD-10-CM

## 2023-05-20 DIAGNOSIS — I10 Essential (primary) hypertension: Secondary | ICD-10-CM | POA: Diagnosis not present

## 2023-05-20 DIAGNOSIS — Z7984 Long term (current) use of oral hypoglycemic drugs: Secondary | ICD-10-CM | POA: Diagnosis not present

## 2023-05-20 DIAGNOSIS — J42 Unspecified chronic bronchitis: Secondary | ICD-10-CM | POA: Diagnosis not present

## 2023-05-20 DIAGNOSIS — R918 Other nonspecific abnormal finding of lung field: Secondary | ICD-10-CM | POA: Diagnosis not present

## 2023-05-20 DIAGNOSIS — R062 Wheezing: Secondary | ICD-10-CM

## 2023-05-20 DIAGNOSIS — E119 Type 2 diabetes mellitus without complications: Secondary | ICD-10-CM

## 2023-05-20 MED ORDER — BENZONATATE 100 MG PO CAPS: 100.0000 mg | ORAL_CAPSULE | Freq: Three times a day (TID) | ORAL | 0 refills | Status: AC | PRN

## 2023-05-20 NOTE — Progress Notes (Signed)
Subjective:    Patient ID: Janice Jacobs, female    DOB: Sep 23, 1960, 62 y.o.   MRN: [id number]  HPI Assessment and Plan    Cough Dry cough for one week with intermittent wheezing. No fever, chills, or body aches. Recent exposure to grandchildren with pneumonia. Possible early upper respiratory infection or reactive airways. -Order chest x-ray to rule out pneumonia as close exposure to household members who have pneumonia. -Prescribe Benzonatate for cough. -Advise use of over-the-counter Flonase for nasal congestion. -Continue Albuterol as needed for wheezing. If usage increases to every 6 hours, contact office for possible additional inhaler.  Hypertension Elevated blood pressure, off medication for two days. -Resume Amlodipine 2.5mg  daily and Lisinopril 40mg  daily. -Continue Hydrochlorothiazide 25mg  as needed for leg swelling.  Diabetes Well-controlled with diet, previously on Metformin. -Schedule metabolic panel and A1c for week of December 30th to confirm continued control.  Follow-up Dependent on chest x-ray results and response to treatment. Will update patient on chest x-ray result and advise on follow-up appointment timing.         Review of Systems  Constitutional:  Negative for chills, fatigue and fever.  Respiratory:  Positive for cough and wheezing. Negative for chest tightness.   Cardiovascular:  Negative for chest pain and palpitations.  Gastrointestinal:  Negative for abdominal pain.  Genitourinary:  Negative for dysuria and frequency.  Musculoskeletal:  Negative for back pain, myalgias and neck stiffness.  Neurological:  Negative for dizziness.  Hematological:  Negative for adenopathy. Does not bruise/bleed easily.  Psychiatric/Behavioral:  Negative for behavioral problems and dysphoric mood. The patient is not nervous/anxious.     Past Medical History:  Diagnosis Date   Arthritis    Blood transfusion without reported diagnosis    Family hx-breast  malignancy    Hyperlipidemia    Hypertension    Scoliosis of lumbar region due to degenerative disease of spine in adult    Smoking 1/2 pack a day or less 04/12/2019   Vitamin D deficiency 05/10/2019     Social History   Socioeconomic History   Marital status: Divorced    Spouse name: Not on file   Number of children: Not on file   Years of education: Not on file   Highest education level: 12th grade  Occupational History   Not on file  Tobacco Use   Smoking status: Every Day    Current packs/day: 0.50    Types: Cigarettes   Smokeless tobacco: Never  Vaping Use   Vaping status: Never Used  Substance and Sexual Activity   Alcohol use: No   Drug use: No   Sexual activity: Never  Other Topics Concern   Not on file  Social History Narrative   Social Documentation   Social Determinants of Health   Financial Resource Strain: High Risk (02/02/2023)   Overall Financial Resource Strain (CARDIA)    Difficulty of Paying Living Expenses: Hard  Food Insecurity: Food Insecurity Present (02/02/2023)   Hunger Vital Sign    Worried About Running Out of Food in the Last Year: Sometimes true    Ran Out of Food in the Last Year: Often true  Transportation Needs: No Transportation Needs (02/02/2023)   PRAPARE - Administrator, Civil Service (Medical): No    Lack of Transportation (Non-Medical): No  Physical Activity: Insufficiently Active (02/02/2023)   Exercise Vital Sign    Days of Exercise per Week: 7 days    Minutes of Exercise per Session: 20 min  Stress: No Stress Concern Present (02/02/2023)   [employer] of Occupational Health - Occupational Stress Questionnaire    Feeling of Stress : Only a little  Social Connections: Moderately Isolated (02/02/2023)   Social Connection and Isolation Panel [NHANES]    Frequency of Communication with Friends and Family: Once a week    Frequency of Social Gatherings with Friends and Family: Once a week    Attends Religious  Services: More than 4 times per year    Active Member of [employer] or Organizations: Yes    Attends Engineer, structural: More than 4 times per year    Marital Status: Divorced  Catering manager Violence: Not on file    Past Surgical History:  Procedure Laterality Date   ABDOMINAL HYSTERECTOMY      Family History  Problem Relation Age of Onset   Colon cancer Neg Hx    Colon polyps Neg Hx    Esophageal cancer Neg Hx    Rectal cancer Neg Hx    Stomach cancer Neg Hx     No Known Allergies  Current Outpatient Medications on File Prior to Visit  Medication Sig Dispense Refill   albuterol (VENTOLIN HFA) 108 (90 Base) MCG/ACT inhaler INHALE 2 PUFFS INTO THE LUNGS EVERY 6 HOURS AS NEEDED 18 g 5   amLODipine (NORVASC) 2.5 MG tablet Take 1 tablet (2.5 mg total) by mouth daily. 90 tablet 3   Blood Glucose Monitoring Suppl (ONE TOUCH ULTRA 2) w/Device KIT Use to test blood sugar once a day.  Dx code: E11.9 1 kit 0   fluticasone (FLONASE) 50 MCG/ACT nasal spray Place 2 sprays into both nostrils daily. 16 g 1   glucose blood (ONETOUCH ULTRA) test strip USE TO TEST BLOOD SUGAR ONCE A DAY 100 strip 2   hydrochlorothiazide (HYDRODIURIL) 25 MG tablet 1 tab po daily as needed pedal edema 10 tablet 0   imiquimod (ALDARA) 5 % cream Apply topically 3 (three) times a week. Apply until total clearance or maximum of 16 weeks 24 each 5   lisinopril (ZESTRIL) 40 MG tablet TAKE 1 TABLET(40 MG) BY MOUTH DAILY 90 tablet 0   meloxicam (MOBIC) 7.5 MG tablet Take 1 tablet (7.5 mg total) by mouth daily. 30 tablet 1   metFORMIN (GLUCOPHAGE) 500 MG tablet TAKE 1 TABLET(500 MG) BY MOUTH DAILY WITH BREAKFAST 90 tablet 1   methocarbamol (ROBAXIN) 750 MG tablet TAKE 1 TABLET BY MOUTH THREE TIMES DAILY AS NEEDED FOR BACK PAIN OR MUSCLE SPASMS 40 tablet 0   methocarbamol (ROBAXIN) 750 MG tablet TAKE 1 TABLET BY MOUTH THREE TIMES DAILY AS NEEDED FOR BACK PAIN OR MUSCLE SPASMS 40 tablet 0   OneTouch Delica Lancets 33G  MISC Use to test blood sugar once a day.  Dx code: E11.9 100 each 1   phenazopyridine (PYRIDIUM) 200 MG tablet Take 1 tablet (200 mg total) by mouth 3 (three) times daily as needed for pain. 10 tablet 0   atorvastatin (LIPITOR) 40 MG tablet Take 1 tablet (40 mg total) by mouth daily. 90 tablet 3   No current facility-administered medications on file prior to visit.    BP (!) 158/88   Pulse 90   Temp 98.8 F (37.1 C) (Oral)   Resp 16   Ht 5' (1.524 m)   Wt 176 lb (79.8 kg)   SpO2 100%   BMI 34.37 kg/m          Objective:   Physical Exam  General Mental Status- Alert.  General Appearance- Not in acute distress.   Skin General: Color- Normal Color. Moisture- Normal Moisture.  Neck Carotid Arteries- Normal color. Moisture- Normal Moisture. No carotid bruits. No JVD.  Chest and Lung Exam Auscultation: Breath Sounds:-Normal.  Cardiovascular Auscultation:Rythm- Regular. Murmurs & Other Heart Sounds:Auscultation of the heart reveals- No Murmurs.  Abdomen Inspection:-Inspeection Normal. Palpation/Percussion:Note:No mass. Palpation and Percussion of the abdomen reveal- Non Tender, Non Distended + BS, no rebound or guarding.   Neurologic Cranial Nerve exam:- CN III-XII intact(No nystagmus), symmetric smile. Strength:- 5/5 equal and symmetric strength both upper and lower extremities.       Assessment & Plan:  Assessment and Plan    Cough Dry cough for one week with intermittent wheezing. No fever, chills, or body aches. Recent exposure to grandchildren with pneumonia. Possible early upper respiratory infection or reactive airways. -Order chest x-ray to rule out pneumonia as close exposure to household members who have pneumonia. -Prescribe Benzonatate for cough. -Advise use of over-the-counter Flonase for nasal congestion. -Continue Albuterol as needed for wheezing. If usage increases to every 6 hours, contact office for possible additional  inhaler.  Hypertension Elevated blood pressure, off medication for two days. -Resume Amlodipine 2.5mg  daily and Lisinopril 40mg  daily. -Continue Hydrochlorothiazide 25mg  as needed for leg swelling.  Diabetes Well-controlled with diet, previously on Metformin. -Schedule metabolic panel and A1c for week of December 30th to confirm continued control.  Follow-up Dependent on chest x-ray results and response to treatment. Will update patient on chest x-ray result and advise on follow-up appointment timing.        Esperanza Richters, PA-C

## 2023-05-20 NOTE — Patient Instructions (Signed)
Cough Dry cough for one week with intermittent wheezing. No fever, chills, or body aches. Recent exposure to grandchildren with pneumonia. Possible early upper respiratory infection or reactive airways. -Order chest x-ray to rule out pneumonia as close exposure to household members who have pneumonia. -Prescribe Benzonatate for cough. -Advise use of over-the-counter Flonase for nasal congestion. -Continue Albuterol as needed for wheezing. If usage increases to every 6 hours, contact office for possible additional inhaler.  Hypertension Elevated blood pressure, off medication for two days. -Resume Amlodipine 2.5mg  daily and Lisinopril 40mg  daily. -Continue Hydrochlorothiazide 25mg  as needed for leg swelling.  Diabetes Well-controlled with diet, previously on Metformin. -Schedule metabolic panel and A1c for week of December 30th to confirm continued control.  Follow-up Dependent on chest x-ray results and response to treatment. Will update patient on chest x-ray result and advise on follow-up appointment timing.

## 2023-05-31 ENCOUNTER — Other Ambulatory Visit: Payer: Self-pay | Admitting: Medical

## 2023-05-31 DIAGNOSIS — M62830 Muscle spasm of back: Secondary | ICD-10-CM

## 2023-05-31 NOTE — Telephone Encounter (Signed)
Rx refill robaxin sent.

## 2023-06-11 ENCOUNTER — Other Ambulatory Visit: Payer: Self-pay

## 2023-06-11 ENCOUNTER — Emergency Department (HOSPITAL_BASED_OUTPATIENT_CLINIC_OR_DEPARTMENT_OTHER): Payer: 59

## 2023-06-11 ENCOUNTER — Emergency Department (HOSPITAL_BASED_OUTPATIENT_CLINIC_OR_DEPARTMENT_OTHER)
Admission: EM | Admit: 2023-06-11 | Discharge: 2023-06-11 | Discharge status: Against Medical Advice | Payer: 59 | Attending: Emergency Medicine | Admitting: Emergency Medicine

## 2023-06-11 DIAGNOSIS — R509 Fever, unspecified: Secondary | ICD-10-CM | POA: Insufficient documentation

## 2023-06-11 DIAGNOSIS — R059 Cough, unspecified: Secondary | ICD-10-CM | POA: Diagnosis not present

## 2023-06-11 DIAGNOSIS — R197 Diarrhea, unspecified: Secondary | ICD-10-CM | POA: Diagnosis not present

## 2023-06-11 DIAGNOSIS — Z5321 Procedure and treatment not carried out due to patient leaving prior to being seen by health care provider: Secondary | ICD-10-CM | POA: Insufficient documentation

## 2023-06-11 DIAGNOSIS — M791 Myalgia, unspecified site: Secondary | ICD-10-CM | POA: Insufficient documentation

## 2023-06-11 DIAGNOSIS — Z1152 Encounter for screening for COVID-19: Secondary | ICD-10-CM | POA: Diagnosis not present

## 2023-06-11 LAB — RESP PANEL BY RT-PCR (RSV, FLU A&B, COVID)  RVPGX2
Influenza A by PCR: NEGATIVE
Influenza B by PCR: NEGATIVE
Resp Syncytial Virus by PCR: NEGATIVE
SARS Coronavirus 2 by RT PCR: NEGATIVE

## 2023-06-11 NOTE — ED Triage Notes (Addendum)
Pt arrives with c/o chills, body aches, cough, and diarrhea that started a few days ago. Pt reports intermittent fevers.

## 2023-06-12 ENCOUNTER — Encounter (HOSPITAL_BASED_OUTPATIENT_CLINIC_OR_DEPARTMENT_OTHER): Payer: Self-pay

## 2023-06-12 ENCOUNTER — Emergency Department (HOSPITAL_BASED_OUTPATIENT_CLINIC_OR_DEPARTMENT_OTHER)
Admission: EM | Admit: 2023-06-12 | Discharge: 2023-06-12 | Disposition: A | Discharge status: Home / Self Care | Payer: 59 | Attending: Emergency Medicine | Admitting: Emergency Medicine

## 2023-06-12 ENCOUNTER — Other Ambulatory Visit: Payer: Self-pay

## 2023-06-12 DIAGNOSIS — I10 Essential (primary) hypertension: Secondary | ICD-10-CM | POA: Insufficient documentation

## 2023-06-12 DIAGNOSIS — Z7951 Long term (current) use of inhaled steroids: Secondary | ICD-10-CM | POA: Diagnosis not present

## 2023-06-12 DIAGNOSIS — F17200 Nicotine dependence, unspecified, uncomplicated: Secondary | ICD-10-CM | POA: Insufficient documentation

## 2023-06-12 DIAGNOSIS — R059 Cough, unspecified: Secondary | ICD-10-CM | POA: Diagnosis not present

## 2023-06-12 DIAGNOSIS — Z79899 Other long term (current) drug therapy: Secondary | ICD-10-CM | POA: Insufficient documentation

## 2023-06-12 DIAGNOSIS — J449 Chronic obstructive pulmonary disease, unspecified: Secondary | ICD-10-CM | POA: Diagnosis not present

## 2023-06-12 DIAGNOSIS — J209 Acute bronchitis, unspecified: Secondary | ICD-10-CM | POA: Insufficient documentation

## 2023-06-12 MED ORDER — AZITHROMYCIN 250 MG PO TABS: 250.0000 mg | ORAL_TABLET | Freq: Every day | ORAL | 0 refills | Status: DC

## 2023-06-12 MED ORDER — PREDNISONE 20 MG PO TABS: 40.0000 mg | ORAL_TABLET | Freq: Every day | ORAL | 0 refills | Status: DC

## 2023-06-12 NOTE — ED Provider Notes (Signed)
[place] [facility] Provider Note   CSN: [id number] Arrival date & time: 06/12/23  4098     History  Chief Complaint  Patient presents with   Cough    Janice Jacobs is a 62 y.o. female with past medical history of COPD, hypertension, hyperlipidemia, smoking presenting to the emergency room with cough that is been ongoing and worsening for a month.  Patient also reports she has had some chills and bodyaches.  Was seen here yesterday but left without being seen.  Had chest x-ray that was normal.  Patient has not had to use increase inhaler.  Denies chest pain or shortness of breath, fevers, weakness, abdominal pain nausea vomiting diarrhea.  Patient is requesting an antibiotic.   Cough      Home Medications Prior to Admission medications   Medication Sig Start Date End Date Taking? Authorizing Provider  albuterol (VENTOLIN HFA) 108 (90 Base) MCG/ACT inhaler INHALE 2 PUFFS INTO THE LUNGS EVERY 6 HOURS AS NEEDED 11/07/21   Saguier, Ramon Dredge, PA-C  amLODipine (NORVASC) 2.5 MG tablet Take 1 tablet (2.5 mg total) by mouth daily. 02/26/23   Saguier, Ramon Dredge, PA-C  atorvastatin (LIPITOR) 40 MG tablet Take 1 tablet (40 mg total) by mouth daily. 09/17/21 01/15/22  Saguier, Ramon Dredge, PA-C  benzonatate (TESSALON) 100 MG capsule Take 1 capsule (100 mg total) by mouth 3 (three) times daily as needed for cough. 05/20/23   Saguier, Ramon Dredge, PA-C  Blood Glucose Monitoring Suppl (ONE TOUCH ULTRA 2) w/Device KIT Use to test blood sugar once a day.  Dx code: E11.9 12/31/21   Saguier, Ramon Dredge, PA-C  fluticasone [facility]) 50 MCG/ACT nasal spray Place 2 sprays into both nostrils daily. 08/29/21   Saguier, Ramon Dredge, PA-C  glucose blood [facility] ULTRA) test strip USE TO TEST BLOOD SUGAR ONCE A DAY 03/10/23   Saguier, Ramon Dredge, PA-C  hydrochlorothiazide (HYDRODIURIL) 25 MG tablet 1 tab po daily as needed pedal edema 03/10/23   Saguier, Ramon Dredge, PA-C  imiquimod [facility]) 5 % cream Apply  topically 3 (three) times a week. Apply until total clearance or maximum of 16 weeks 12/03/21   Levie Heritage, DO  lisinopril (ZESTRIL) 40 MG tablet TAKE 1 TABLET(40 MG) BY MOUTH DAILY 02/26/23   Saguier, Ramon Dredge, PA-C  meloxicam (MOBIC) 7.5 MG tablet Take 1 tablet (7.5 mg total) by mouth daily. 03/30/23   Saguier, Ramon Dredge, PA-C  metFORMIN (GLUCOPHAGE) 500 MG tablet TAKE 1 TABLET(500 MG) BY MOUTH DAILY WITH BREAKFAST 02/26/23   Saguier, Ramon Dredge, PA-C  methocarbamol (ROBAXIN) 750 MG tablet TAKE 1 TABLET BY MOUTH THREE TIMES DAILY AS NEEDED FOR BACK PAIN OR MUSCLE SPASMS 03/30/23   Saguier, Ramon Dredge, PA-C  methocarbamol (ROBAXIN) 750 MG tablet TAKE 1 TABLET BY MOUTH THREE TIMES DAILY AS NEEDED FOR BACK PAIN OR MUSCLE SPASMS 05/31/23   Saguier, Ramon Dredge, PA-C  OneTouch Delica Lancets 33G MISC Use to test blood sugar once a day.  Dx code: E11.9 12/31/21   Saguier, Ramon Dredge, PA-C  phenazopyridine (PYRIDIUM) 200 MG tablet Take 1 tablet (200 mg total) by mouth 3 (three) times daily as needed for pain. 02/02/23   Clayborne Dana, NP      Allergies    Patient has no known allergies.    Review of Systems   Review of Systems  Respiratory:  Positive for cough.     Physical Exam Updated Vital Signs BP 110/68   Pulse 90   Temp 98.6 F (37 C) (Oral)   Resp 18  Ht 5' (1.524 m)   Wt 89 kg   SpO2 98%   BMI 38.32 kg/m  Physical Exam Vitals and nursing note reviewed.  Constitutional:      General: She is not in acute distress.    Appearance: She is not toxic-appearing.  HENT:     Head: Normocephalic and atraumatic.  Eyes:     General: No scleral icterus.    Conjunctiva/sclera: Conjunctivae normal.  Cardiovascular:     Rate and Rhythm: Normal rate and regular rhythm.     Pulses: Normal pulses.     Heart sounds: Normal heart sounds.  Pulmonary:     Effort: Pulmonary effort is normal. No respiratory distress.     Breath sounds: Rhonchi present.     Comments: Mild diffuse rhonchi, no signs of respiratory  distress, normal respiratory effort Abdominal:     General: Abdomen is flat. Bowel sounds are normal.     Palpations: Abdomen is soft.     Tenderness: There is no abdominal tenderness.  Skin:    General: Skin is warm and dry.     Findings: No lesion.  Neurological:     General: No focal deficit present.     Mental Status: She is alert and oriented to person, place, and time. Mental status is at baseline.     ED Results / Procedures / Treatments   Labs (all labs ordered are listed, but only abnormal results are displayed) Labs Reviewed - No data to display  EKG None  Radiology DG Chest 2 View Result Date: 06/11/2023 CLINICAL DATA:  Cough, body aches EXAM: CHEST - 2 VIEW COMPARISON:  05/20/2023 FINDINGS: Frontal and lateral views of the chest demonstrate an unremarkable cardiac silhouette. No airspace disease, effusion, or pneumothorax. No acute bony abnormalities. IMPRESSION: 1. No acute intrathoracic process. Electronically Signed   By: Sharlet Salina M.D.   On: 06/11/2023 22:57    Procedures Procedures    Medications Ordered in ED Medications - No data to display  ED Course/ Medical Decision Making/ A&P                                 Medical Decision Making Risk Prescription drug management.   Janice Jacobs 62 y.o. presented today for URI like symptoms. Working DDx that I considered at this time includes, but not limited to, viral illness, pharyngitis, mono, sinusitis, electrolyte abnormality, AOM, medication side effect.   R/o DDx: these additional diagnoses are not consistent with patient's history, presentation, physical exam, labs/imaging findings.  Review of prior external notes: 06/11/23, 05/20/23  Labs:  None ordered today however I reviewed recent respiratory panel performed yesterday which was negative  Imaging:  Chest x-ray not obtained today however reviewed patient's chest x-ray from yesterday, left without being seen by a provider.  No acute  pathology on chest x-ray.  Problem List / ED Course / Critical interventions / Medication management  Patient coming to emergency room with 1 month of cough.  Patient has had some productive cough.  Patient has history of COPD.  Mild rhonchi on exam.  Patient not feeling shortness of breath or chest pain.  X-ray shows no pneumonia.  Patient hemodynamically stable well-appearing no fever and no tachycardia.  With patient's history of COPD and concerned this is COPD exacerbation versus an acute bronchitis.  Will send azithromycin and steroids to pharmacy.  Encourage patient to use inhalers at home and stop smoking.  Patient agrees to plan all questions answered.  Stable for discharge.  Patients vitals assessed. Upon arrival patient is hemodynamically stable.  I have reviewed the patients home medicines and have made adjustments as needed     Plan:  F/u w/ PCP in 2-3d to ensure resolution of sx.  Patient was given return precautions. Patient stable for discharge at this time.  Patient educated on sx and dx and verbalized understanding of plan. Return to ER if new or worsening sx.          Final Clinical Impression(s) / ED Diagnoses Final diagnoses:  Acute bronchitis, unspecified organism    Rx / DC Orders ED Discharge Orders          Ordered    azithromycin (ZITHROMAX) 250 MG tablet  Daily        06/12/23 1140    predniSONE (DELTASONE) 20 MG tablet  Daily        06/12/23 1140              Maryanna Stuber, Horald Chestnut, PA-C 06/12/23 1145    Tegeler, Canary Brim, MD 06/12/23 1258

## 2023-06-12 NOTE — Discharge Instructions (Addendum)
You are seen in the emergency room today for cough.  Since it has been ongoing for a month of send antibiotic and steroid to your pharmacy.  Please continue to take your at home inhalers.  Follow-up with primary care to ensure resolution of symptoms and return to emergency room if you have any new or worsening symptoms.  Make sure you are staying well-hydrated. Stop smoking.

## 2023-06-12 NOTE — ED Triage Notes (Signed)
The patient has had a cough for a month. She stated yesterday she stated having chills and body aches. She was seen here last night but left due to the long wait.

## 2023-06-14 ENCOUNTER — Telehealth: Payer: Self-pay

## 2023-06-14 ENCOUNTER — Encounter: Payer: Self-pay | Admitting: *Deleted

## 2023-06-14 NOTE — Telephone Encounter (Signed)
Copied from CRM [id number]. Topic: General - Call Back - No Documentation >> Jun 14, 2023 11:50 AM Janice Jacobs wrote: Reason for CRM: pt is requesting a call back seen in the ER, needs an  dr. [name]. Please call pt at [phone]

## 2023-06-14 NOTE — Telephone Encounter (Signed)
Pt seen at ED 

## 2023-06-14 NOTE — Telephone Encounter (Signed)
Letter sent   Pt scheduled for 06/18/2023

## 2023-06-14 NOTE — Telephone Encounter (Signed)
Initial Comment Caller states since their last office visit they have gotten worst. The symptoms are chills, dizziness, runny nose, body aches and coughing. GOTO Facility Not Listed [place] [facility] [address] Translation No Nurse Assessment Nurse: Doylene Canard, RN, Lesa Date/Time (Eastern Time): 06/11/2023 8:20:59 PM Confirm and document reason for call. If symptomatic, describe symptoms. ---Caller states she has cold, cough, body aches, chills and dizziness that started 30 days ago. Denies fever. She is at work and left her Albuterol inhaler at home Does the patient have any new or worsening symptoms? ---Yes Will a triage be completed? ---Yes Related visit to physician within the last 2 weeks? ---Yes Does the PT have any chronic conditions? (i.e. diabetes, asthma, this includes High risk factors for pregnancy, etc.) ---Yes List chronic conditions. ---asthma Is this a behavioral health or substance abuse call? ---No Guidelines Guideline Title Affirmed Question Affirmed Notes Nurse Date/Time (Eastern Time) Asthma Attack [1] MODERATE asthma attack (e.g., SOB at rest, speaks in phrases, audible wheezes) AND [2] doesn't have neb or inhaler available Conner, RN, Lesa 06/11/2023 8:23:38 PM PLEASE NOTE: All timestamps contained within this report are represented as [place] Standard Time. CONFIDENTIALTY NOTICE: This fax transmission is intended only for the addressee. It contains information that is legally privileged, confidential or otherwise protected from use or disclosure. If you are not the intended recipient, you are strictly prohibited from reviewing, disclosing, copying using or disseminating any of this information or taking any action in reliance on or regarding this information. If you have received this fax in error, please notify us immediately by telephone so that we can arrange for its return to [place]. Phone: [phone], Toll-Free: [phone], Fax: [phone] Page: 2 of 2 Call  Id: [id number] Disp. Time Lamount Cohen Time) Disposition Final User 06/11/2023 8:03:01 PM Attempt made - message left Conner, RN, Gean Maidens 06/11/2023 8:26:37 PM Go to ED Now Yes Doylene Canard, RN, Gean Maidens Final Disposition 06/11/2023 8:26:37 PM Go to ED Now Yes Conner, RN, Lesa Caller Disagree/Comply Comply Caller Understands Yes PreDisposition Did not know what to do Care Advice Given Per Guideline GO TO ED NOW: * You need to be seen in the Emergency Department. * Go to the ED at ___________ Hospital. * Leave now. Drive carefully. NOTE TO TRIAGER - DRIVING: * Another adult should drive. * Patient should not delay going to the emergency department. ANOTHER ADULT SHOULD DRIVE: * It is better and safer if another adult drives instead of you. BRING MEDICINES: * Bring the pill bottles too. This will help the doctor (or NP/PA) to make certain you are taking the right medicines and the right dose. CARE ADVICE given per Asthma Attack (Adult) guideline. Referrals GO TO FACILITY OTHER - SPECIFY

## 2023-06-18 ENCOUNTER — Ambulatory Visit: Payer: 59 | Admitting: Medical

## 2023-06-22 ENCOUNTER — Telehealth: Payer: Self-pay

## 2023-06-22 NOTE — Telephone Encounter (Signed)
 No show letter

## 2023-08-05 ENCOUNTER — Other Ambulatory Visit: Payer: Self-pay | Admitting: Medical

## 2023-08-05 DIAGNOSIS — M62830 Muscle spasm of back: Secondary | ICD-10-CM

## 2023-08-05 DIAGNOSIS — I10 Essential (primary) hypertension: Secondary | ICD-10-CM

## 2023-08-05 MED ORDER — METHOCARBAMOL 750 MG PO TABS: ORAL_TABLET | ORAL | 0 refills | Status: DC

## 2023-08-05 MED ORDER — LISINOPRIL 40 MG PO TABS: 40.0000 mg | ORAL_TABLET | Freq: Every day | ORAL | 0 refills | Status: DC

## 2023-08-05 NOTE — Telephone Encounter (Signed)
 Sent in med. But have pt scheduled follow up with me. I put in future labs for cmp and A1c back in December. Looks like labs were never done. Also want to discuss use muscle relaxant/her musculoskeletal issues.

## 2023-08-06 ENCOUNTER — Other Ambulatory Visit: Payer: Self-pay

## 2023-08-06 DIAGNOSIS — I10 Essential (primary) hypertension: Secondary | ICD-10-CM

## 2023-08-06 MED ORDER — LISINOPRIL 40 MG PO TABS: 40.0000 mg | ORAL_TABLET | Freq: Every day | ORAL | 0 refills | Status: DC

## 2023-08-06 NOTE — Telephone Encounter (Signed)
 Pt called and stated her car is broken down and will call back to schedule an appt when its fixed in within the next 2 weeks

## 2023-09-30 ENCOUNTER — Other Ambulatory Visit: Payer: Self-pay | Admitting: Medical

## 2023-09-30 DIAGNOSIS — I10 Essential (primary) hypertension: Secondary | ICD-10-CM

## 2023-09-30 DIAGNOSIS — M62830 Muscle spasm of back: Secondary | ICD-10-CM

## 2023-09-30 MED ORDER — LISINOPRIL 40 MG PO TABS: 40.0000 mg | ORAL_TABLET | Freq: Every day | ORAL | 0 refills | Status: DC

## 2023-09-30 MED ORDER — METHOCARBAMOL 750 MG PO TABS: ORAL_TABLET | ORAL | 0 refills | Status: DC

## 2023-10-12 ENCOUNTER — Other Ambulatory Visit: Payer: Self-pay | Admitting: Medical

## 2023-12-16 ENCOUNTER — Other Ambulatory Visit: Payer: Self-pay | Admitting: Medical

## 2023-12-22 ENCOUNTER — Encounter: Payer: Self-pay | Admitting: Medical

## 2023-12-22 ENCOUNTER — Ambulatory Visit: Payer: Self-pay | Admitting: Medical

## 2023-12-22 ENCOUNTER — Ambulatory Visit (INDEPENDENT_AMBULATORY_CARE_PROVIDER_SITE_OTHER): Payer: Self-pay | Admitting: Medical

## 2023-12-22 VITALS — BP 170/90 | HR 65 | Ht 66.0 in | Wt 183.4 lb

## 2023-12-22 DIAGNOSIS — E782 Mixed hyperlipidemia: Secondary | ICD-10-CM

## 2023-12-22 DIAGNOSIS — R5383 Other fatigue: Secondary | ICD-10-CM

## 2023-12-22 DIAGNOSIS — Z0000 Encounter for general adult medical examination without abnormal findings: Secondary | ICD-10-CM

## 2023-12-22 DIAGNOSIS — E119 Type 2 diabetes mellitus without complications: Secondary | ICD-10-CM

## 2023-12-22 DIAGNOSIS — Z1211 Encounter for screening for malignant neoplasm of colon: Secondary | ICD-10-CM

## 2023-12-22 DIAGNOSIS — Z23 Encounter for immunization: Secondary | ICD-10-CM

## 2023-12-22 LAB — COMPREHENSIVE METABOLIC PANEL WITH GFR
ALT: 12 U/L (ref 0–35)
AST: 15 U/L (ref 0–37)
Albumin: 4.1 g/dL (ref 3.5–5.2)
Alkaline Phosphatase: 87 U/L (ref 39–117)
BUN: 20 mg/dL (ref 6–23)
CO2: 28 meq/L (ref 19–32)
Calcium: 9.2 mg/dL (ref 8.4–10.5)
Chloride: 107 meq/L (ref 96–112)
Creatinine, Ser: 1.03 mg/dL (ref 0.40–1.20)
GFR: 59.04 mL/min — ABNORMAL LOW (ref >60.00)
Glucose, Bld: 93 mg/dL (ref 70–99)
Potassium: 4.1 meq/L (ref 3.5–5.1)
Sodium: 141 meq/L (ref 135–145)
Total Bilirubin: 0.3 mg/dL (ref 0.2–1.2)
Total Protein: 6.8 g/dL (ref 6.0–8.3)

## 2023-12-22 LAB — CBC WITH DIFFERENTIAL/PLATELET
Basophils Absolute: 0 K/uL (ref 0.0–0.1)
Basophils Relative: 0.6 % (ref 0.0–3.0)
Eosinophils Absolute: 0.2 K/uL (ref 0.0–0.7)
Eosinophils Relative: 3.3 % (ref 0.0–5.0)
HCT: 42.4 % (ref 36.0–46.0)
Hemoglobin: 14.2 g/dL (ref 12.0–15.0)
Lymphocytes Relative: 39.1 % (ref 12.0–46.0)
Lymphs Abs: 2.8 K/uL (ref 0.7–4.0)
MCHC: 33.5 g/dL (ref 30.0–36.0)
MCV: 91.7 fl (ref 78.0–100.0)
Monocytes Absolute: 0.3 K/uL (ref 0.1–1.0)
Monocytes Relative: 4.8 % (ref 3.0–12.0)
Neutro Abs: 3.7 K/uL (ref 1.4–7.7)
Neutrophils Relative %: 52.2 % (ref 43.0–77.0)
Platelets: 232 K/uL (ref 150.0–400.0)
RBC: 4.62 Mil/uL (ref 3.87–5.11)
RDW: 14.8 % (ref 11.5–15.5)
WBC: 7.1 K/uL (ref 4.0–10.5)

## 2023-12-22 LAB — LIPID PANEL
Cholesterol: 213 mg/dL — ABNORMAL HIGH (ref 0–200)
HDL: 40.2 mg/dL (ref >39.00)
LDL Cholesterol: 154 mg/dL — ABNORMAL HIGH (ref 0–99)
NonHDL: 172.93
Total CHOL/HDL Ratio: 5
Triglycerides: 95 mg/dL (ref 0.0–149.0)
VLDL: 19 mg/dL (ref 0.0–40.0)

## 2023-12-22 LAB — T4, FREE: Free T4: 0.65 ng/dL (ref 0.60–1.60)

## 2023-12-22 LAB — MICROALBUMIN / CREATININE URINE RATIO
Creatinine,U: 98 mg/dL
Microalb Creat Ratio: UNDETERMINED mg/g (ref 0.0–30.0)
Microalb, Ur: <0.7 mg/dL

## 2023-12-22 LAB — HEMOGLOBIN A1C: Hgb A1c MFr Bld: 6.6 % — ABNORMAL HIGH (ref 4.6–6.5)

## 2023-12-22 LAB — VITAMIN B12: Vitamin B-12: 416 pg/mL (ref 211–911)

## 2023-12-22 LAB — TSH: TSH: 0.48 u[IU]/mL (ref 0.35–5.50)

## 2023-12-22 MED ORDER — HYDROCHLOROTHIAZIDE 12.5 MG PO CAPS: 12.5000 mg | ORAL_CAPSULE | Freq: Every day | ORAL | 0 refills | Status: DC

## 2023-12-22 MED ORDER — VALSARTAN 160 MG PO TABS: 160.0000 mg | ORAL_TABLET | Freq: Every day | ORAL | 0 refills | Status: DC

## 2023-12-22 MED ORDER — ATORVASTATIN CALCIUM 40 MG PO TABS: 40.0000 mg | ORAL_TABLET | Freq: Every day | ORAL | 3 refills | Status: AC

## 2023-12-22 NOTE — Progress Notes (Signed)
 Subjective:    Patient ID: Janice Jacobs, female    DOB: 08/22/1960, 63 y.o.   MRN: [id number]  HPI   Pt in for wellness exam.  Pt working at [employer], not exercising apart from work. Eating moderate healthy. Smoker 1/2 pack day. Smoked since 63 years old. No  alcohol  Janice Jacobs is a 63 year old female with hypertension and diabetes who presents with elevated blood pressure and headaches.  She has been experiencing elevated blood pressure readings, with a recent measurement of 170/90 mmHg. She is currently taking lisinopril  40 mg daily but has occasionally doubled the dose to manage headaches, which she describes as 'annoying' and pressure-like. She did not take her medication on the morning of the visit but took it the previous day.  She has a history of diabetes and is undergoing routine screening, including a urine microalbumin test to check for proteinuria. She has previously been on hydrochlorothiazide  25 mg for leg swelling, which she used occasionally without side effects.  She underwent a partial hysterectomy and retains her ovaries but not her uterus. She had a well woman exam in 2023, and a Pap smear was deemed unnecessary.  She reports feeling fatigued and is concerned about her energy levels. She is currently on atorvastatin  40 mg daily for cholesterol management.  No current nausea, vomiting, or severe vision changes. She maintains good balance without dizziness or spinning. No numbness in her feet.     Review of Systems  Constitutional:  Negative for chills, fatigue and fever.  HENT:  Negative for congestion and ear pain.   Eyes:  Negative for pain and visual disturbance.  Respiratory:  Negative for cough, chest tightness and wheezing.   Cardiovascular:  Negative for chest pain and palpitations.  Genitourinary:  Negative for dysuria, flank pain and frequency.  Musculoskeletal:  Negative for back pain and neck pain.  Skin:  Negative for rash.  Neurological:   Positive for headaches. Negative for dizziness, syncope, facial asymmetry, speech difficulty, weakness, light-headedness and numbness.       Mild  Psychiatric/Behavioral:  Negative for behavioral problems and confusion. The patient is not nervous/anxious.     Past Medical History:  Diagnosis Date   Arthritis    Blood transfusion without reported diagnosis    Family hx-breast malignancy    Hyperlipidemia    Hypertension    Scoliosis of lumbar region due to degenerative disease of spine in adult    Smoking 1/2 pack a day or less 04/12/2019   Vitamin D  deficiency 05/10/2019     Social History   Socioeconomic History   Marital status: Divorced    Spouse name: Not on file   Number of children: Not on file   Years of education: Not on file   Highest education level: 12th grade  Occupational History   Not on file  Tobacco Use   Smoking status: Every Day    Current packs/day: 0.50    Types: Cigarettes   Smokeless tobacco: Never  Vaping Use   Vaping status: Never Used  Substance and Sexual Activity   Alcohol use: No   Drug use: No   Sexual activity: Never  Other Topics Concern   Not on file  Social History Narrative   Social Documentation   Social Drivers of Health   Financial Resource Strain: High Risk (02/02/2023)   Overall Financial Resource Strain (CARDIA)    Difficulty of Paying Living Expenses: Hard  Food Insecurity: Food Insecurity Present (02/02/2023)  Hunger Vital Sign    Worried About Running Out of Food in the Last Year: Sometimes true    Ran Out of Food in the Last Year: Often true  Transportation Needs: No Transportation Needs (02/02/2023)   PRAPARE - Administrator, Civil Service (Medical): No    Lack of Transportation (Non-Medical): No  Physical Activity: Insufficiently Active (02/02/2023)   Exercise Vital Sign    Days of Exercise per Week: 7 days    Minutes of Exercise per Session: 20 min  Stress: No Stress Concern Present (02/02/2023)    [employer] of Occupational Health - Occupational Stress Questionnaire    Feeling of Stress : Only a little  Social Connections: Moderately Isolated (02/02/2023)   Social Connection and Isolation Panel    Frequency of Communication with Friends and Family: Once a week    Frequency of Social Gatherings with Friends and Family: Once a week    Attends Religious Services: More than 4 times per year    Active Member of [employer] or Organizations: Yes    Attends Engineer, structural: More than 4 times per year    Marital Status: Divorced  Catering manager Violence: Not on file    Past Surgical History:  Procedure Laterality Date   ABDOMINAL HYSTERECTOMY      Family History  Problem Relation Age of Onset   Colon cancer Neg Hx    Colon polyps Neg Hx    Esophageal cancer Neg Hx    Rectal cancer Neg Hx    Stomach cancer Neg Hx     No Known Allergies  Current Outpatient Medications on File Prior to Visit  Medication Sig Dispense Refill   albuterol  (VENTOLIN  HFA) 108 (90 Base) MCG/ACT inhaler INHALE 2 PUFFS INTO THE LUNGS EVERY 6 HOURS AS NEEDED 18 g 5   amLODipine  (NORVASC ) 2.5 MG tablet TAKE 1 TABLET(2.5 MG) BY MOUTH DAILY 90 tablet 3   atorvastatin  (LIPITOR) 40 MG tablet Take 1 tablet (40 mg total) by mouth daily. 90 tablet 3   Blood Glucose Monitoring Suppl (ONE TOUCH ULTRA 2) w/Device KIT Use to test blood sugar once a day.  Dx code: E11.9 1 kit 0   fluticasone  (FLONASE ) 50 MCG/ACT nasal spray Place 2 sprays into both nostrils daily. 16 g 1   glucose blood (ONETOUCH ULTRA) test strip USE TO TEST BLOOD SUGAR ONCE A DAY 100 strip 2   imiquimod  (ALDARA ) 5 % cream Apply topically 3 (three) times a week. Apply until total clearance or maximum of 16 weeks 24 each 5   meloxicam  (MOBIC ) 7.5 MG tablet Take 1 tablet (7.5 mg total) by mouth daily. 30 tablet 1   metFORMIN  (GLUCOPHAGE ) 500 MG tablet TAKE 1 TABLET(500 MG) BY MOUTH DAILY WITH BREAKFAST 90 tablet 1   methocarbamol   (ROBAXIN ) 750 MG tablet TAKE 1 TABLET BY MOUTH THREE TIMES DAILY AS NEEDED FOR BACK PAIN OR MUSCLE SPASMS 40 tablet 0   methocarbamol  (ROBAXIN ) 750 MG tablet TAKE 1 TABLET BY MOUTH THREE TIMES DAILY AS NEEDED FOR BACK PAIN OR MUSCLE SPASMS 40 tablet 0   OneTouch Delica Lancets 33G MISC Use to test blood sugar once a day.  Dx code: E11.9 100 each 1   phenazopyridine  (PYRIDIUM ) 200 MG tablet Take 1 tablet (200 mg total) by mouth 3 (three) times daily as needed for pain. 10 tablet 0   azithromycin  (ZITHROMAX ) 250 MG tablet Take 1 tablet (250 mg total) by mouth daily. Take first 2 tablets together,  then 1 every day until finished. (Patient not taking: Reported on 12/22/2023) 6 tablet 0   benzonatate  (TESSALON ) 100 MG capsule Take 1 capsule (100 mg total) by mouth 3 (three) times daily as needed for cough. (Patient not taking: Reported on 12/22/2023) 30 capsule 0   predniSONE  (DELTASONE ) 20 MG tablet Take 2 tablets (40 mg total) by mouth daily. (Patient not taking: Reported on 12/22/2023) 10 tablet 0   No current facility-administered medications on file prior to visit.    BP (!) 170/90   Pulse 65   Ht 5' 6 (1.676 m)   Wt 183 lb 6.4 oz (83.2 kg)   SpO2 100%   BMI 29.60 kg/m        Objective:   Physical Exam  General Mental Status- Alert. General Appearance- Not in acute distress.   Skin General: Color- Normal Color. Moisture- Normal Moisture.  Neck Carotid Arteries- Normal color. Moisture- Normal Moisture. No carotid bruits. No JVD.  Chest and Lung Exam Auscultation: Breath Sounds:-Normal.  Cardiovascular Auscultation:Rythm- Regular. Murmurs & Other Heart Sounds:Auscultation of the heart reveals- No Murmurs.  Abdomen Inspection:-Inspeection Normal. Palpation/Percussion:Note:No mass. Palpation and Percussion of the abdomen reveal- Non Tender, Non Distended + BS, no rebound or guarding.    Neurologic Cranial Nerve exam:- CN III-XII intact(No nystagmus), symmetric smile. Drift  Test:- No drift. Finger to Nose:- Normal/Intact Strength:- 5/5 equal and symmetric strength both upper and lower extremities.       Assessment & Plan:   Patient Instructions  For you wellness exam today I have ordered cbc and lipid panel.  Vaccine given today.   Recommend exercise and healthy diet.  We will let you know lab results as they come in.  Follow up date appointment will be determined after lab review.    Hypertension Blood pressure uncontrolled on current lisinopril  regimen. Discussed hypertension risks and emergency symptoms. - Discontinue lisinopril . - Prescribe valsartan  160 mg once daily. - Prescribe hydrochlorothiazide  12.5 mg once daily. - Advise monitoring for vision changes, slurred speech, weakness, vomiting, or dizziness, and seek emergency care if these occur. - Schedule follow-up in one week to reassess blood pressure.  Type 2 Diabetes Mellitus Urine microalbumin test to assess for diabetic nephropathy. - Order urine microalbumin test. - Order hemoglobin A1c test.  Hyperlipidemia Previously managed with atorvastatin  40 mg daily. -after lab review will restart but maybe dose change?  Fatigue Fatigue with unclear etiology. - Order B12 level. - Order thyroid  function tests.  General Health Maintenance Due for age-appropriate vaccinations and screenings. Discussed shingles risk and vaccination benefits. - Administer Shingrix  vaccine today and schedule second dose in 2-6 months. - Administer pneumonia vaccine today. - Order mammogram. - Place referral for colonoscopy and instruct to follow up with gastroenterologist if not contacted within 5-7 days. - Advise flu vaccine in September or October. - Discuss potential benefits of COVID-19 vaccination.    00785 charge as addressed htn, diabetes, fatigue and ha(likey bp related)

## 2023-12-22 NOTE — Addendum Note (Signed)
 Addended by: DORINA DALLAS HERO on: 12/22/2023 05:03 PM   Modules accepted: Orders

## 2023-12-22 NOTE — Addendum Note (Signed)
 Addended by: ORVIN HARLENE HERO on: 12/22/2023 09:51 AM   Modules accepted: Orders

## 2023-12-22 NOTE — Patient Instructions (Addendum)
 For you wellness exam today I have ordered cbc, cmp and lipid panel.  Vaccine given today.   Recommend exercise and healthy diet.  We will let you know lab results as they come in.  Follow up date appointment will be determined after lab review.    Hypertension Blood pressure uncontrolled on current lisinopril  regimen. Discussed hypertension risks and emergency symptoms. - Discontinue lisinopril . - Prescribe valsartan  160 mg once daily. - Prescribe hydrochlorothiazide  12.5 mg once daily. - Advise monitoring for vision changes, slurred speech, weakness, vomiting, or dizziness, and seek emergency care if these occur. - Schedule follow-up in one week to reassess blood pressure.  Type 2 Diabetes Mellitus Urine microalbumin test to assess for diabetic nephropathy. - Order urine microalbumin test. - Order hemoglobin A1c test.  Hyperlipidemia Previously managed with atorvastatin  40 mg daily. -after lab review will restart but maybe dose change?  Fatigue Fatigue with unclear etiology. - Order B12 level. - Order thyroid  function tests.  General Health Maintenance Due for age-appropriate vaccinations and screenings. Discussed shingles risk and vaccination benefits. - Administer Shingrix  vaccine today and schedule second dose in 2-6 months. - Administer pneumonia vaccine today. - Order mammogram. - Place referral for colonoscopy and instruct to follow up with gastroenterologist if not contacted within 5-7 days. - Advise flu vaccine in September or October. - Discuss potential benefits of COVID-19 vaccination.  Preventive Care 83-61 Years Old, Female Preventive care refers to lifestyle choices and visits with your health care provider that can promote health and wellness. Preventive care visits are also called wellness exams. What can I expect for my preventive care visit? Counseling Your health care provider may ask you questions about your: Medical history, including: Past  medical problems. Family medical history. Pregnancy history. Current health, including: Menstrual cycle. Method of birth control. Emotional well-being. Home life and relationship well-being. Sexual activity and sexual health. Lifestyle, including: Alcohol, nicotine or tobacco, and drug use. Access to firearms. Diet, exercise, and sleep habits. Work and work Astronomer. Sunscreen use. Safety issues such as seatbelt and bike helmet use. Physical exam Your health care provider will check your: Height and weight. These may be used to calculate your BMI (body mass index). BMI is a measurement that tells if you are at a healthy weight. Waist circumference. This measures the distance around your waistline. This measurement also tells if you are at a healthy weight and may help predict your risk of certain diseases, such as type 2 diabetes and high blood pressure. Heart rate and blood pressure. Body temperature. Skin for abnormal spots. What immunizations do I need?  Vaccines are usually given at various ages, according to a schedule. Your health care provider will recommend vaccines for you based on your age, medical history, and lifestyle or other factors, such as travel or where you work. What tests do I need? Screening Your health care provider may recommend screening tests for certain conditions. This may include: Lipid and cholesterol levels. Diabetes screening. This is done by checking your blood sugar (glucose) after you have not eaten for a while (fasting). Pelvic exam and Pap test. Hepatitis B test. Hepatitis C test. HIV (human immunodeficiency virus) test. STI (sexually transmitted infection) testing, if you are at risk. Lung cancer screening. Colorectal cancer screening. Mammogram. Talk with your health care provider about when you should start having regular mammograms. This may depend on whether you have a family history of breast cancer. BRCA-related cancer screening.  This may be done if you have a family  history of breast, ovarian, tubal, or peritoneal cancers. Bone density scan. This is done to screen for osteoporosis. Talk with your health care provider about your test results, treatment options, and if necessary, the need for more tests. Follow these instructions at home: Eating and drinking  Eat a diet that includes fresh fruits and vegetables, whole grains, lean protein, and low-fat dairy products. Take vitamin and mineral supplements as recommended by your health care provider. Do not drink alcohol if: Your health care provider tells you not to drink. You are pregnant, may be pregnant, or are planning to become pregnant. If you drink alcohol: Limit how much you have to 0-1 drink a day. Know how much alcohol is in your drink. In the [place] one drink equals one 12 oz bottle of beer (355 mL), one 5 oz glass of wine (148 mL), or one 1 oz glass of hard liquor (44 mL). Lifestyle Brush your teeth every morning and night with fluoride toothpaste. Floss one time each day. Exercise for at least 30 minutes 5 or more days each week. Do not use any products that contain nicotine or tobacco. These products include cigarettes, chewing tobacco, and vaping devices, such as e-cigarettes. If you need help quitting, ask your health care provider. Do not use drugs. If you are sexually active, practice safe sex. Use a condom or other form of protection to prevent STIs. If you do not wish to become pregnant, use a form of birth control. If you plan to become pregnant, see your health care provider for a prepregnancy visit. Take aspirin only as told by your health care provider. Make sure that you understand how much to take and what form to take. Work with your health care provider to find out whether it is safe and beneficial for you to take aspirin daily. Find healthy ways to manage stress, such as: Meditation, yoga, or listening to music. Journaling. Talking to a  trusted person. Spending time with friends and family. Minimize exposure to UV radiation to reduce your risk of skin cancer. Safety Always wear your seat belt while driving or riding in a vehicle. Do not drive: If you have been drinking alcohol. Do not ride with someone who has been drinking. When you are tired or distracted. While texting. If you have been using any mind-altering substances or drugs. Wear a helmet and other protective equipment during sports activities. If you have firearms in your house, make sure you follow all gun safety procedures. Seek help if you have been physically or sexually abused. What's next? Visit your health care provider once a year for an annual wellness visit. Ask your health care provider how often you should have your eyes and teeth checked. Stay up to date on all vaccines. This information is not intended to replace advice given to you by your health care provider. Make sure you discuss any questions you have with your health care provider. Document Revised: 11/27/2020 Document Reviewed: 11/27/2020 Elsevier Patient Education  2024 [employer].

## 2023-12-27 ENCOUNTER — Inpatient Hospital Stay (HOSPITAL_BASED_OUTPATIENT_CLINIC_OR_DEPARTMENT_OTHER): Admission: RE | Admit: 2023-12-27 | Payer: Self-pay | Source: Ambulatory Visit

## 2023-12-29 ENCOUNTER — Encounter: Payer: Self-pay | Admitting: Medical

## 2023-12-29 ENCOUNTER — Ambulatory Visit (INDEPENDENT_AMBULATORY_CARE_PROVIDER_SITE_OTHER): Payer: Self-pay | Admitting: Medical

## 2023-12-29 VITALS — BP 150/90 | HR 70 | Temp 98.0°F | Resp 18 | Ht 66.0 in | Wt 184.4 lb

## 2023-12-29 DIAGNOSIS — Z7984 Long term (current) use of oral hypoglycemic drugs: Secondary | ICD-10-CM | POA: Diagnosis not present

## 2023-12-29 DIAGNOSIS — Z1211 Encounter for screening for malignant neoplasm of colon: Secondary | ICD-10-CM

## 2023-12-29 DIAGNOSIS — E785 Hyperlipidemia, unspecified: Secondary | ICD-10-CM | POA: Diagnosis not present

## 2023-12-29 DIAGNOSIS — I10 Essential (primary) hypertension: Secondary | ICD-10-CM | POA: Diagnosis not present

## 2023-12-29 DIAGNOSIS — E119 Type 2 diabetes mellitus without complications: Secondary | ICD-10-CM

## 2023-12-29 LAB — GLUCOSE, POCT (MANUAL RESULT ENTRY): POC Glucose: 105 mg/dL — AB (ref 70–99)

## 2023-12-29 NOTE — Patient Instructions (Signed)
 Hypertension Blood pressure remains elevated at 150/90 mmHg despite valsartan  160 mg and HCTZ 12.5 mg. Discussed increasing valsartan  dosage for better control. Emphasized home monitoring. - Increase valsartan  to 320 mg daily. - Continue HCTZ 12.5 mg daily. - Obtain a home blood pressure cuff and monitor blood pressure daily. - Report blood pressure readings in 10 days.  Increased Cardiovascular Risk Ten-year cardiovascular risk score is 63.5%, indicating very high risk. Risk factors include age, smoking, hypertension, hyperlipidemia, and diabetes. Emphasized controlling modifiable risk factors. - Address hypertension, diabetes, hyperlipidemia, and smoking cessation as outlined in respective plans.  Type 2 Diabetes Mellitus A1c is 6.6%. Previously on metformin , discontinued due to well-managed glucose levels.  - Recheck A1c in three months. - low sugar diet but if a1c does not drop recommend medication again - Consider restarting metformin  if A1c increases.  Hyperlipidemia On atorvastatin  40 mg. Discussed importance of continuing medication to reduce cardiovascular risk. - Continue atorvastatin  40 mg daily.  Tobacco Use Disorder Continues to smoke, increasing cardiovascular risk. Discussed health impact and encouraged cessation. She believes she can quit without medication. - Encourage smoking cessation. - Follow up on smoking cessation progress in 10 days. -could rx meds to help you stop smoking.  General Health Maintenance Needs to reschedule mammogram due to insurance issues. - Reschedule and complete mammogram.  Follow up in 3 months or sooner if needed

## 2023-12-29 NOTE — Progress Notes (Signed)
   Subjective:    Patient ID: Janice Jacobs, female    DOB: [birth year], 63 y.o.   MRN: [id number]  HPI Janice Jacobs is a 63 year old female with hypertension and diabetes who presents for follow-up on her cardiovascular risk and blood pressure management.  She has a history of diabetes with a recent A1c of 6.6%. She was previously on metformin  but discontinued it in January when her blood sugar levels were under control. Her recent blood sugar readings have been 108, 105, and 84 mg/dL. No side effects from metformin , and her kidney function has improved since stopping the medication.  She is currently taking atorvastatin  40 mg at night for cholesterol management.  Her blood pressure has been elevated, with a recent reading of 150/90 mmHg. She is on valsartan  160 mg and HCTZ 12.5 mg, which she takes daily. She does not have a blood pressure cuff at home to monitor her readings.  She continues to smoke and is considering quitting. She is in the process of rescheduling a mammogram due to insurance issues after starting with a new company.   Review of Systems See hpi    Objective:   Physical Exam  General Mental Status- Alert. General Appearance- Not in acute distress.   Skin General: Color- Normal Color. Moisture- Normal Moisture.  Neck Carotid Arteries- Normal color. Moisture- Normal Moisture. No carotid bruits. No JVD.  Chest and Lung Exam Auscultation: Breath Sounds:-Normal.  Cardiovascular Auscultation:Rythm- Regular. Murmurs & Other Heart Sounds:Auscultation of the heart reveals- No Murmurs.  Abdomen Inspection:-Inspeection Normal. Palpation/Percussion:Note:No mass. Palpation and Percussion of the abdomen reveal- Non Tender, Non Distended + BS, no rebound or guarding.    Neurologic Cranial Nerve exam:- CN III-XII intact(No nystagmus), symmetric smile.  Strength:- 5/5 equal and symmetric strength both upper and lower extremities.       Assessment & Plan:    Patient Instructions  Hypertension Blood pressure remains elevated at 150/90 mmHg despite valsartan  160 mg and HCTZ 12.5 mg. Discussed increasing valsartan  dosage for better control. Emphasized home monitoring. - Increase valsartan  to 320 mg daily. - Continue HCTZ 12.5 mg daily. - Obtain a home blood pressure cuff and monitor blood pressure daily. - Report blood pressure readings in 10 days.  Increased Cardiovascular Risk Ten-year cardiovascular risk score is 63.5%, indicating very high risk. Risk factors include age, smoking, hypertension, hyperlipidemia, and diabetes. Emphasized controlling modifiable risk factors. - Address hypertension, diabetes, hyperlipidemia, and smoking cessation as outlined in respective plans.  Type 2 Diabetes Mellitus A1c is 6.6%. Previously on metformin , discontinued due to well-managed glucose levels.  - Recheck A1c in three months. - low sugar diet but if a1c does not drop recommend medication again - Consider restarting metformin  if A1c increases.  Hyperlipidemia On atorvastatin  40 mg. Discussed importance of continuing medication to reduce cardiovascular risk. - Continue atorvastatin  40 mg daily.  Tobacco Use Disorder Continues to smoke, increasing cardiovascular risk. Discussed health impact and encouraged cessation. She believes she can quit without medication. - Encourage smoking cessation. - Follow up on smoking cessation progress in 10 days. -could rx meds to help you stop smoking.  General Health Maintenance Needs to reschedule mammogram due to insurance issues. - Reschedule and complete mammogram.  Follow up in 3 months or sooner if needed   [employer], PA-C

## 2023-12-30 ENCOUNTER — Encounter: Payer: Self-pay | Admitting: Medical

## 2024-01-09 ENCOUNTER — Encounter (HOSPITAL_BASED_OUTPATIENT_CLINIC_OR_DEPARTMENT_OTHER): Payer: Self-pay | Admitting: Emergency Medicine

## 2024-01-09 ENCOUNTER — Other Ambulatory Visit: Payer: Self-pay

## 2024-01-09 ENCOUNTER — Emergency Department (HOSPITAL_BASED_OUTPATIENT_CLINIC_OR_DEPARTMENT_OTHER)
Admission: EM | Admit: 2024-01-09 | Discharge: 2024-01-10 | Disposition: A | Discharge status: Home / Self Care | Attending: Emergency Medicine | Admitting: Emergency Medicine

## 2024-01-09 DIAGNOSIS — R799 Abnormal finding of blood chemistry, unspecified: Secondary | ICD-10-CM | POA: Insufficient documentation

## 2024-01-09 DIAGNOSIS — N9089 Other specified noninflammatory disorders of vulva and perineum: Secondary | ICD-10-CM | POA: Diagnosis not present

## 2024-01-09 DIAGNOSIS — N949 Unspecified condition associated with female genital organs and menstrual cycle: Secondary | ICD-10-CM

## 2024-01-09 LAB — CBG MONITORING, ED: Glucose-Capillary: 104 mg/dL — ABNORMAL HIGH (ref 70–99)

## 2024-01-09 NOTE — ED Notes (Signed)
 Pt requesting to have cbg checked.

## 2024-01-09 NOTE — ED Provider Notes (Signed)
 [place] EMERGENCY DEPARTMENT AT [facility] [place]  Provider Note  CSN: [id number] Arrival date & time: 01/09/24 1945  History No chief complaint on file.   Janice Jacobs is a 63 y.o. female reports history of genital warts, states she began having some discomfort in her R perineal area about a week ago she thought was an outbreak. She began applying a cream she had been given before, but it was burning really badly so she stopped using it. She has used OTC monistat and Vaseline for comfort the last few days but the symptoms have been getting worse.    Home Medications Prior to Admission medications   Medication Sig Start Date End Date Taking? Authorizing Provider  albuterol  (VENTOLIN  HFA) 108 (90 Base) MCG/ACT inhaler INHALE 2 PUFFS INTO THE LUNGS EVERY 6 HOURS AS NEEDED 11/07/21   Saguier, Dallas, PA-C  amLODipine  (NORVASC ) 2.5 MG tablet TAKE 1 TABLET(2.5 MG) BY MOUTH DAILY 10/12/23   Saguier, Dallas, PA-C  atorvastatin  (LIPITOR) 40 MG tablet Take 1 tablet (40 mg total) by mouth daily. 12/22/23 04/20/24  Saguier, Dallas, PA-C  azithromycin  (ZITHROMAX ) 250 MG tablet Take 1 tablet (250 mg total) by mouth daily. Take first 2 tablets together, then 1 every day until finished. Patient not taking: Reported on 12/22/2023 06/12/23   Barrett, Jamie N, PA-C  benzonatate  (TESSALON ) 100 MG capsule Take 1 capsule (100 mg total) by mouth 3 (three) times daily as needed for cough. Patient not taking: Reported on 12/22/2023 05/20/23   Saguier, Dallas, PA-C  Blood Glucose Monitoring Suppl (ONE TOUCH ULTRA 2) w/Device KIT Use to test blood sugar once a day.  Dx code: E11.9 12/31/21   Saguier, Dallas, PA-C  fluticasone  (FLONASE ) 50 MCG/ACT nasal spray Place 2 sprays into both nostrils daily. 08/29/21   Saguier, Dallas, PA-C  glucose blood [facility] ULTRA) test strip USE TO TEST BLOOD SUGAR ONCE A DAY 03/10/23   Saguier, Dallas, PA-C  hydrochlorothiazide  (MICROZIDE ) 12.5 MG capsule Take 1 capsule (12.5 mg total)  by mouth daily. 12/22/23   Saguier, Dallas, PA-C  imiquimod  (ALDARA ) 5 % cream Apply topically 3 (three) times a week. Apply until total clearance or maximum of 16 weeks 12/03/21   Stinson, Jacob J, DO  meloxicam  (MOBIC ) 7.5 MG tablet Take 1 tablet (7.5 mg total) by mouth daily. 03/30/23   Saguier, Dallas, PA-C  metFORMIN  (GLUCOPHAGE ) 500 MG tablet TAKE 1 TABLET(500 MG) BY MOUTH DAILY WITH BREAKFAST 02/26/23   Saguier, Dallas, PA-C  methocarbamol  (ROBAXIN ) 750 MG tablet TAKE 1 TABLET BY MOUTH THREE TIMES DAILY AS NEEDED FOR BACK PAIN OR MUSCLE SPASMS 03/30/23   Saguier, Dallas, PA-C  methocarbamol  (ROBAXIN ) 750 MG tablet TAKE 1 TABLET BY MOUTH THREE TIMES DAILY AS NEEDED FOR BACK PAIN OR MUSCLE SPASMS 09/30/23   Saguier, Dallas, PA-C  OneTouch Delica Lancets 33G MISC Use to test blood sugar once a day.  Dx code: E11.9 12/31/21   Saguier, Dallas, PA-C  phenazopyridine  (PYRIDIUM ) 200 MG tablet Take 1 tablet (200 mg total) by mouth 3 (three) times daily as needed for pain. 02/02/23   Almarie Waddell NOVAK, NP  predniSONE  (DELTASONE ) 20 MG tablet Take 2 tablets (40 mg total) by mouth daily. Patient not taking: Reported on 12/22/2023 06/12/23   Barrett, Warren SAILOR, PA-C  valsartan  (DIOVAN ) 160 MG tablet Take 1 tablet (160 mg total) by mouth daily. 12/22/23   Saguier, Dallas, PA-C     Allergies    Patient has no known allergies.   Review  of Systems   Review of Systems Please see HPI for pertinent positives and negatives  Physical Exam BP (!) 164/101 (BP Location: Left Arm)   Pulse 77   Temp 98.2 F (36.8 C)   Resp 20   Ht 5' 6 (1.676 m)   SpO2 97%   BMI 29.76 kg/m   Physical Exam Vitals and nursing note reviewed. Exam conducted with a chaperone present.  HENT:     Head: Normocephalic.     Nose: Nose normal.  Eyes:     Extraocular Movements: Extraocular movements intact.  Pulmonary:     Effort: Pulmonary effort is normal.  Genitourinary:    Comments: Mild white vaginal discharge, could be from  monistat use. There is an area of shallow ulcerations and erythema to the R lower introitus. No fluctuance Musculoskeletal:        General: Normal range of motion.     Cervical back: Neck supple.  Skin:    Findings: No rash (on exposed skin).  Neurological:     Mental Status: She is alert and oriented to person, place, and time.  Psychiatric:        Mood and Affect: Mood normal.     ED Results / Procedures / Treatments   EKG None  Procedures Procedures  Medications Ordered in the ED Medications - No data to display  Initial Impression and Plan  Patient here with genital discomfort, thought it might be genital warts, but on exam more concerning for HSV. She denies history of same. Will check vaginal swabs and HSV swab of the lesions.   ED Course       MDM Rules/Calculators/A&P Medical Decision Making Amount and/or Complexity of Data Reviewed Labs: ordered.     Final Clinical Impression(s) / ED Diagnoses Final diagnoses:  None    Rx / DC Orders ED Discharge Orders     None

## 2024-01-09 NOTE — ED Triage Notes (Signed)
 Pt POV reports possible abscess to perineal area x1 week. Reports she has been applying cream for vaginal warts prior, felt lump forming that was painful, so stopped applying cream.   Denies fever, drainage.

## 2024-01-10 ENCOUNTER — Telehealth: Payer: Self-pay | Admitting: Medical

## 2024-01-10 DIAGNOSIS — Z124 Encounter for screening for malignant neoplasm of cervix: Secondary | ICD-10-CM

## 2024-01-10 DIAGNOSIS — N949 Unspecified condition associated with female genital organs and menstrual cycle: Secondary | ICD-10-CM

## 2024-01-10 LAB — WET PREP, GENITAL
Clue Cells Wet Prep HPF POC: NONE SEEN
Sperm: NONE SEEN
Trich, Wet Prep: NONE SEEN
WBC, Wet Prep HPF POC: <10
Yeast Wet Prep HPF POC: NONE SEEN

## 2024-01-10 MED ORDER — HYDROCODONE-ACETAMINOPHEN 5-325 MG PO TABS: 1.0000 | ORAL_TABLET | Freq: Four times a day (QID) | ORAL | 0 refills | Status: AC | PRN

## 2024-01-10 MED ORDER — VALACYCLOVIR HCL 1 G PO TABS: 1000.0000 mg | ORAL_TABLET | Freq: Three times a day (TID) | ORAL | 0 refills | Status: DC

## 2024-01-10 MED ORDER — VALACYCLOVIR HCL 500 MG PO TABS
1000.0000 mg | ORAL_TABLET | Freq: Once | ORAL | Status: AC
  Administered 2024-01-10: 1000 mg via ORAL
  Filled 2024-01-10: qty 2

## 2024-01-10 NOTE — Telephone Encounter (Signed)
 Patient called and aware.

## 2024-01-10 NOTE — Telephone Encounter (Signed)
 Pt seen recently in the ED for genital lesion. I have replaced referral to gyn in 2023 for pap. Will go ahead and refer again to gynecologist at [facility] this time for both reasons. She can follow up with me as well. Have pt update us  if 2 weeks passes and no call from gyn. Also ask pt to look at my chart message. I think she will get referral message with number to call. They may call her as well.

## 2024-01-11 ENCOUNTER — Telehealth: Payer: Self-pay

## 2024-01-11 ENCOUNTER — Other Ambulatory Visit: Payer: Self-pay | Admitting: Medical

## 2024-01-11 LAB — GC/CHLAMYDIA PROBE AMP (~~LOC~~) NOT AT ARMC
Chlamydia: NEGATIVE
Comment: NEGATIVE
Comment: NORMAL
Neisseria Gonorrhea: NEGATIVE

## 2024-01-11 LAB — RPR: RPR Ser Ql: NONREACTIVE

## 2024-01-11 NOTE — Telephone Encounter (Signed)
 Copied from CRM [id number]. Topic: Clinical - Medication Refill >> Jan 11, 2024  8:59 AM Revonda D wrote: Medication: valsartan  (DIOVAN ) 160 MG tablet  Has the patient contacted their pharmacy? Yes (Agent: If no, request that the patient contact the pharmacy for the refill. If patient does not wish to contact the pharmacy document the reason why and proceed with request.) (Agent: If yes, when and what did the pharmacy advise?)  This is the patient's preferred pharmacy:  [id number] [place], [place] - [address] [place] [address] Phone: [phone] Fax: [phone]  Is this the correct pharmacy for this prescription? Yes If no, delete pharmacy and type the correct one.   Has the prescription been filled recently? Yes  Is the patient out of the medication? Yes  Has the patient been seen for an appointment in the last year OR does the patient have an upcoming appointment? Yes  Can we respond through MyChart? Yes  Agent: Please be advised that Rx refills may take up to 3 business days. We ask that you follow-up with your pharmacy.

## 2024-01-11 NOTE — Telephone Encounter (Signed)
 Copied from CRM [id number]. Topic: Clinical - Medication Question >> Jan 11, 2024  9:04 AM Revonda D wrote: Reason for CRM: Pt stated that she is out of the valsartan  (DIOVAN ) 160 MG tablet and the pharmacy stated that she doesn't have another refill until August. Pt stated that she was originally supposed to take 1 tablet when the medication was prescribed but Dr.Saguier advised her to start taking 2 pills and that's why she is currently out. Pt submitted a medication refill request and would like for it to be approved and also receive a callback with an update.

## 2024-01-12 ENCOUNTER — Other Ambulatory Visit: Payer: Self-pay | Admitting: Medical

## 2024-01-12 ENCOUNTER — Other Ambulatory Visit: Payer: Self-pay

## 2024-01-12 LAB — HSV CULTURE AND TYPING

## 2024-01-12 MED ORDER — VALSARTAN 320 MG PO TABS: 320.0000 mg | ORAL_TABLET | Freq: Every day | ORAL | 3 refills | Status: AC

## 2024-01-12 NOTE — Addendum Note (Signed)
 Addended by: DORINA DALLAS HERO on: 01/12/2024 12:53 PM   Modules accepted: Orders

## 2024-01-12 NOTE — Telephone Encounter (Signed)
 Called pharmacy and they have filled the valsartan  320 and will be ready for pick up today Also called pt and left voicemail advising her that her medication would be ready by 3pm 01/12/24

## 2024-01-18 ENCOUNTER — Telehealth: Payer: Self-pay

## 2024-01-18 NOTE — Telephone Encounter (Signed)
 Left voicemail to see if patient has been scheduled for her OBGYN appointment.

## 2024-01-19 ENCOUNTER — Other Ambulatory Visit: Payer: Self-pay | Admitting: Medical

## 2024-03-08 ENCOUNTER — Other Ambulatory Visit: Payer: Self-pay | Admitting: Medical

## 2024-03-08 ENCOUNTER — Telehealth: Payer: Self-pay | Admitting: Medical

## 2024-03-08 DIAGNOSIS — M62830 Muscle spasm of back: Secondary | ICD-10-CM

## 2024-03-08 MED ORDER — METHOCARBAMOL 750 MG PO TABS: ORAL_TABLET | ORAL | 0 refills | Status: DC

## 2024-03-08 NOTE — Telephone Encounter (Unsigned)
 Copied from CRM #[id number]. Topic: Clinical - Medication Refill >> Mar 08, 2024 11:48 AM Alfonso ORN wrote: Medication: methocarbamol  (ROBAXIN ) 750 MG tablet  Has the patient contacted their pharmacy? {yes/no:20286} (Agent: If no, request that the patient contact the pharmacy for the refill. If patient does not wish to contact the pharmacy document the reason why and proceed with request.) (Agent: If yes, when and what did the pharmacy advise?)  This is the patient's preferred pharmacy:  [id number] [place], [place] - [address] [place] [address] Phone: [phone] Fax: [phone]  [id number] - [place], [place] - [address] AT [facility] OF [facility] & [address] [facility] [place] [address] Phone: [phone] Fax: [phone]  Is this the correct pharmacy for this prescription? Yes If no, delete pharmacy and type the correct one.   Has the prescription been filled recently? No  Is the patient out of the medication? Yes  Has the patient been seen for an appointment in the last year OR does the patient have an upcoming appointment? Yes  Can we respond through MyChart? Yes  Agent: Please be advised that Rx refills may take up to 3 business days. We ask that you follow-up with your pharmacy.

## 2024-03-08 NOTE — Telephone Encounter (Signed)
 Copied from CRM #[id number]. Topic: Clinical - Medication Refill >> Mar 08, 2024 11:48 AM Alfonso ORN wrote: Medication: methocarbamol  (ROBAXIN ) 750 MG tablet  Has the patient contacted their pharmacy? Yes (Agent: If no, request that the patient contact the pharmacy for the refill. If patient does not wish to contact the pharmacy document the reason why and proceed with request.) (Agent: If yes, when and what did the pharmacy advise?)  This is the patient's preferred pharmacy:  [id number] [place], [place] - [address] [place] [address] Phone: [phone] Fax: [phone]  [id number] - [place], [place] - [address] AT [facility] OF [facility] & [address] [facility] [place] [address] Phone: [phone] Fax: [phone]  Is this the correct pharmacy for this prescription? Yes If no, delete pharmacy and type the correct one.   Has the prescription been filled recently? No  Is the patient out of the medication? Yes  Has the patient been seen for an appointment in the last year OR does the patient have an upcoming appointment? Yes  Can we respond through MyChart? Yes  Agent: Please be advised that Rx refills may take up to 3 business days. We ask that you follow-up with your pharmacy.

## 2024-04-02 ENCOUNTER — Other Ambulatory Visit: Payer: Self-pay | Admitting: Medical

## 2024-04-18 ENCOUNTER — Telehealth: Payer: Self-pay

## 2024-04-18 NOTE — Telephone Encounter (Signed)
 Pt called

## 2024-04-18 NOTE — Telephone Encounter (Signed)
 Copied from CRM [id number]. Topic: General - Other >> Apr 18, 2024  2:59 PM Thersia BROCKS wrote: Reason for CRM: Patient called in stated she received a missed call from office, would like a callback

## 2024-04-18 NOTE — Telephone Encounter (Signed)
 Called pt and notified her that the referrals were placed

## 2024-04-18 NOTE — Addendum Note (Signed)
 Addended by: DORINA DALLAS HERO on: 04/18/2024 03:54 PM   Modules accepted: Orders

## 2024-04-18 NOTE — Telephone Encounter (Signed)
 Called pt to see if she still needed the referral to gyn she stated no but she does need a referral to get her mammogram as well as her colonoscopy. I advised her that I would notify pcp and get back with her

## 2024-05-10 ENCOUNTER — Other Ambulatory Visit: Payer: Self-pay | Admitting: Medical

## 2024-05-10 DIAGNOSIS — M62830 Muscle spasm of back: Secondary | ICD-10-CM

## 2024-05-10 NOTE — Telephone Encounter (Unsigned)
 Copied from CRM [id number]. Topic: Clinical - Medication Refill >> May 10, 2024  3:26 PM Franky GRADE wrote: Medication: methocarbamol  (ROBAXIN ) 750 MG tablet [[id number]]  Has the patient contacted their pharmacy? No (Agent: If no, request that the patient contact the pharmacy for the refill. If patient does not wish to contact the pharmacy document the reason why and proceed with request.) (Agent: If yes, when and what did the pharmacy advise?)  This is the patient's preferred pharmacy:  [id number] [place], [place] - [address] [place] [address] Phone: [phone] Fax: [phone]    Is this the correct pharmacy for this prescription? Yes If no, delete pharmacy and type the correct one.   Has the prescription been filled recently? No  Is the patient out of the medication? Yes  Has the patient been seen for an appointment in the last year OR does the patient have an upcoming appointment? Yes  Can we respond through MyChart? Yes  Agent: Please be advised that Rx refills may take up to 3 business days. We ask that you follow-up with your pharmacy.

## 2024-05-12 ENCOUNTER — Other Ambulatory Visit: Payer: Self-pay | Admitting: Medical

## 2024-05-15 ENCOUNTER — Telehealth: Payer: Self-pay

## 2024-05-15 MED ORDER — METHOCARBAMOL 750 MG PO TABS: ORAL_TABLET | ORAL | 0 refills | Status: DC

## 2024-05-15 NOTE — Telephone Encounter (Signed)
 Called pt to notif her that she is overdue for her fu appointment. She was understanding. Apt scheduled for wed 05/17/24

## 2024-05-15 NOTE — Telephone Encounter (Signed)
 Refilled muscle relaxant. Pt is due for follow up visit. Can you get her scheduled this month or early January.

## 2024-05-17 ENCOUNTER — Ambulatory Visit: Admitting: Medical

## 2024-05-24 ENCOUNTER — Ambulatory Visit (HOSPITAL_BASED_OUTPATIENT_CLINIC_OR_DEPARTMENT_OTHER)
Admission: RE | Admit: 2024-05-24 | Discharge: 2024-05-24 | Disposition: A | Discharge status: Home / Self Care | Source: Ambulatory Visit | Attending: Medical | Admitting: Medical

## 2024-05-24 ENCOUNTER — Ambulatory Visit: Admitting: Medical

## 2024-05-24 ENCOUNTER — Ambulatory Visit: Payer: Self-pay | Admitting: Medical

## 2024-05-24 VITALS — BP 128/80 | HR 88 | Temp 98.4°F | Resp 16 | Ht 66.0 in | Wt 194.6 lb

## 2024-05-24 DIAGNOSIS — Z23 Encounter for immunization: Secondary | ICD-10-CM

## 2024-05-24 DIAGNOSIS — M25561 Pain in right knee: Secondary | ICD-10-CM

## 2024-05-24 DIAGNOSIS — I10 Essential (primary) hypertension: Secondary | ICD-10-CM

## 2024-05-24 DIAGNOSIS — M25562 Pain in left knee: Secondary | ICD-10-CM | POA: Insufficient documentation

## 2024-05-24 DIAGNOSIS — F17200 Nicotine dependence, unspecified, uncomplicated: Secondary | ICD-10-CM

## 2024-05-24 DIAGNOSIS — R5383 Other fatigue: Secondary | ICD-10-CM

## 2024-05-24 DIAGNOSIS — E782 Mixed hyperlipidemia: Secondary | ICD-10-CM

## 2024-05-24 DIAGNOSIS — E119 Type 2 diabetes mellitus without complications: Secondary | ICD-10-CM

## 2024-05-24 LAB — COMPREHENSIVE METABOLIC PANEL WITH GFR
ALT: 15 U/L (ref 0–35)
AST: 15 U/L (ref 0–37)
Albumin: 4.4 g/dL (ref 3.5–5.2)
Alkaline Phosphatase: 95 U/L (ref 39–117)
BUN: 13 mg/dL (ref 6–23)
CO2: 31 meq/L (ref 19–32)
Calcium: 9.2 mg/dL (ref 8.4–10.5)
Chloride: 103 meq/L (ref 96–112)
Creatinine, Ser: 0.97 mg/dL (ref 0.40–1.20)
GFR: 63.26 mL/min (ref >60.00)
Glucose, Bld: 120 mg/dL — ABNORMAL HIGH (ref 70–99)
Potassium: 4.4 meq/L (ref 3.5–5.1)
Sodium: 140 meq/L (ref 135–145)
Total Bilirubin: 0.3 mg/dL (ref 0.2–1.2)
Total Protein: 6.9 g/dL (ref 6.0–8.3)

## 2024-05-24 LAB — LIPID PANEL
Cholesterol: 167 mg/dL (ref 0–200)
HDL: 35 mg/dL — ABNORMAL LOW (ref >39.00)
LDL Cholesterol: 106 mg/dL — ABNORMAL HIGH (ref 0–99)
NonHDL: 132.33
Total CHOL/HDL Ratio: 5
Triglycerides: 132 mg/dL (ref 0.0–149.0)
VLDL: 26.4 mg/dL (ref 0.0–40.0)

## 2024-05-24 LAB — HEMOGLOBIN A1C: Hgb A1c MFr Bld: 7 % — ABNORMAL HIGH (ref 4.6–6.5)

## 2024-05-24 NOTE — Patient Instructions (Addendum)
 Bilateral knee pain with suspected osteoarthritis Suspected osteoarthritis due to joint space narrowing. Pain affects daily activities. Tylenol  provides some relief, ibuprofen  avoided due to kidney concerns. - Ordered knee x-rays to confirm osteoarthritis. - Continue Tylenol  650 mg every 6-8 hours as needed. - Consider Tylenol  625 mg with ibuprofen  200 mg if needed, monitor kidney function. - Advised low-impact exercises, avoid lower extremity exercises if osteoarthritis confirmed.  Essential hypertension Blood pressure improved to 128/80 mmHg. Cardiovascular risk score decreased due to better control. - Continue valsartan , amlodipine , and hydrochlorothiazide .  Type 2 diabetes mellitus A1c at 6.6%, indicating good control. Breakfast includes fried chicken, potentially impacting cholesterol. - Ordered A1c test.  Mixed hyperlipidemia Started on cholesterol medication. Cardiovascular risk score could improve with better control. - Ordered lipid panel. - Continue cholesterol medication.  Nicotine dependence Smoking is a major cardiovascular risk factor. - Advised smoking cessation.  Fatigue Fatigue persists despite normal thyroid  function tests. - Ordered fatigue labs.  General health maintenance Flu vaccine today - Administered second Shingrix  vaccine.  Follow up date to be determined after lab review

## 2024-05-24 NOTE — Progress Notes (Signed)
 Subjective:    Patient ID: Janice Jacobs, female    DOB: 1961/01/13, 63 y.o.   MRN: [id number]  HPI  LILIANAH BUFFIN is a 63 year old female with hypertension, diabetes, and high cholesterol who presents with joint pain and fatigue.  She has had bilateral knee and ankle pain for the past one to two months, worse in colder weather, with a crunchy sensation in her knees. Pain is partially relieved by Tylenol  650 mg, two tablets every six to eight hours.  She has gained about ten pounds to 194 pounds and feels fatigued. She works on her feet all day. She takes daily energy pills before work.  Her blood pressure is improved to 128/80 mmHg from 150/90 mmHg on valsartan  320 mg, amlodipine  2.5 mg, and nighttime hydrochlorothiazide , which does not cause bothersome urination.  Her last A1c was 6.6%. She recently became consistent with taking her cholesterol medication daily.  She smokes. She has received shingles and pneumonia vaccines but has not had a flu vaccine this season.          The 10-year ASCVD risk score (Arnett DK, et al., 2019) is: 36.2%   Values used to calculate the score:     Age: 8 years     Clincally relevant sex: Female     Is Non-Hispanic African American: Yes     Diabetic: Yes     Tobacco smoker: Yes     Systolic Blood Pressure: 128 mmHg     Is BP treated: Yes     HDL Cholesterol: 40.2 mg/dL     Total Cholesterol: 213 mg/dL    Review of Systems     Objective:   Physical Exam  General Mental Status- Alert. General Appearance- Not in acute distress.   Skin General: Color- Normal Color. Moisture- Normal Moisture.  Neck Carotid Arteries- Normal color. Moisture- Normal Moisture. No carotid bruits. No JVD.  Chest and Lung Exam Auscultation: Breath Sounds:-Normal.  Cardiovascular Auscultation:Rythm- Regular. Murmurs & Other Heart Sounds:Auscultation of the heart reveals- No Murmurs.  Abdomen Inspection:-Inspeection  Normal. Palpation/Percussion:Note:No mass. Palpation and Percussion of the abdomen reveal- Non Tender, Non Distended + BS, no rebound or guarding.    Neurologic Cranial Nerve exam:- CN III-XII intact(No nystagmus), symmetric smile. Drift Test:- No drift. Romberg Exam:- Negative.  Heal to Toe Gait exam:-Normal. Finger to Nose:- Normal/Intact Strength:- 5/5 equal and symmetric strength both upper and lower extremities.   Knees- bilateral moderate crepitus on range of motion.     Assessment & Plan:   Bilateral knee pain with suspected osteoarthritis Suspected osteoarthritis due to joint space narrowing. Pain affects daily activities. Tylenol  provides some relief, ibuprofen  avoided due to kidney concerns. - Ordered knee x-rays to confirm osteoarthritis. - Continue Tylenol  650 mg every 6-8 hours as needed. - Consider Tylenol  625 mg with ibuprofen  200 mg if needed, monitor kidney function. - Advised low-impact exercises, avoid lower extremity exercises if osteoarthritis confirmed.  Essential hypertension Blood pressure improved to 128/80 mmHg. Cardiovascular risk score decreased due to better control. - Continue valsartan , amlodipine , and hydrochlorothiazide .  Type 2 diabetes mellitus A1c at 6.6%, indicating good control. Breakfast includes fried chicken, potentially impacting cholesterol. - Ordered A1c test.  Mixed hyperlipidemia Started on cholesterol medication. Cardiovascular risk score could improve with better control. - Ordered lipid panel. - Continue cholesterol medication.  Nicotine dependence Smoking is a major cardiovascular risk factor. - Advised smoking cessation.  Fatigue Fatigue persists despite normal thyroid  function tests. - Ordered fatigue labs.  General health maintenance Flu vaccine today. - Administered second Shingrix  vaccine.  Follow up date to be determined after lab review  Dallas Maxwell, PA-C

## 2024-05-31 ENCOUNTER — Ambulatory Visit (HOSPITAL_BASED_OUTPATIENT_CLINIC_OR_DEPARTMENT_OTHER)

## 2024-06-15 ENCOUNTER — Other Ambulatory Visit: Payer: Self-pay | Admitting: Medical

## 2024-06-22 ENCOUNTER — Encounter: Payer: Self-pay | Admitting: Medical

## 2024-06-23 ENCOUNTER — Other Ambulatory Visit: Payer: Self-pay

## 2024-06-23 DIAGNOSIS — M62830 Muscle spasm of back: Secondary | ICD-10-CM

## 2024-06-23 MED ORDER — METHOCARBAMOL 750 MG PO TABS: ORAL_TABLET | ORAL | 0 refills | Status: AC

## 2024-07-04 ENCOUNTER — Ambulatory Visit: Payer: Self-pay

## 2024-07-04 ENCOUNTER — Encounter: Payer: Self-pay | Admitting: Pediatrics

## 2024-07-04 NOTE — Telephone Encounter (Signed)
 FYI Only or Action Required?: Action required by provider: decline ER requesting appointment today .  Patient was last seen in primary care on 05/24/2024 by Dorina Loving, PA-C.  Called Nurse Triage reporting Headache, balance issues, and Shortness of Breath.  Symptoms began yesterday.  Interventions attempted: Rest, hydration, or home remedies.  Symptoms are: gradually worsening.  Triage Disposition: Go to ED Now (Notify PCP)  Patient/caregiver understands and will follow disposition?: No, refuses disposition      Reason for Disposition  Unable to walk, or can only walk with assistance (e.g., requires support)  Answer Assessment - Initial Assessment Questions Patient reports symptoms started right eye twitching started yesterday, headache today exhausted. Ate something and still feel same way. Last night kept waking up bad muscle spasms both legs. Main concern headache 5/10, tylenol  for headache , pain above right eye. Per chart review history diabetes and hypertension. Vision baseline . Shortness of breath , rapid breathing , more often with walking and sitting.  Hydrating. Balance is new off when walking this is new. Advised to go to ER for evaluation  patient declined this and requesting appointment in office . Patient requesting call back to schedule. Patient agrees with 911 worsening symptoms n meantime including but not limited to numbness weakness one sided, changes in speech, syncope, chest pain, worsening shortness of breath This RN calling CAL now    1. LOCATION: Where does it hurt?      Above right eye  2. ONSET: When did the headache start? (e.g., minutes, hours, days)      Last night  3. PATTERN: Does the pain come and go, or has it been constant since it started?     Constant  4. SEVERITY: How bad is the pain? and What does it keep you from doing?  (e.g., Scale 1-10; mild, moderate, or severe)     5./10 now   8. HEAD INJURY: Has there been any recent  injury to your head?      Denies  9. OTHER SYMPTOMS: Do you have any other symptoms? (e.g., fever, stiff neck, eye pain, sore throat, cold symptoms)     Shortness of breath at rest and exertion new , balance issues , fatigue/exhaustion    Patient denies the following Chest pain ,Vomiting, nausea  Protocols used: [facility]  Message from Meridian F sent at 07/04/2024  9:36 AM EST  Reason for Triage: feeling lightheaded, headache, right eye twitching, feels extremely fatigue, is concerned.

## 2024-07-04 NOTE — Telephone Encounter (Signed)
 Call placed to [facility] this RN spoke with Kaylee and reported patient declined ER and requesting appointment today

## 2024-07-05 ENCOUNTER — Ambulatory Visit: Admitting: Medical

## 2024-07-19 ENCOUNTER — Ambulatory Visit (AMBULATORY_SURGERY_CENTER)

## 2024-07-19 ENCOUNTER — Encounter: Payer: Self-pay | Admitting: Pediatrics

## 2024-07-19 ENCOUNTER — Other Ambulatory Visit: Payer: Self-pay | Admitting: Pediatrics

## 2024-07-19 ENCOUNTER — Other Ambulatory Visit: Payer: Self-pay | Admitting: Medical

## 2024-07-19 VITALS — Ht 60.0 in | Wt 175.0 lb

## 2024-07-19 DIAGNOSIS — Z860100 Personal history of colon polyps, unspecified: Secondary | ICD-10-CM

## 2024-07-19 MED ORDER — NA SULFATE-K SULFATE-MG SULF 17.5-3.13-1.6 GM/177ML PO SOLN: 1.0000 | Freq: Once | ORAL | 0 refills | Status: AC

## 2024-07-19 NOTE — Progress Notes (Signed)
 RN confirmed patient name, date of birth, and address RN confirmed date and time of procedure RN reviewed and confirmed allergies  RN reviewed and updated current medications; confirmed preferred pharmacy Pt is not on diet pills nor GLP-1 medications Pt is not on blood thinners RN reviewed medical & surgical hx  Pt denies issues with chronic constipation  Diabetic - under control No A fib or A flutter No cardiac tests are pending  Pt is not on home 02  No issues known with past sedation with any surgeries or procedures Patient denies ever being told they had issues or difficulty with intubation  Patient unaware of any fh of malignant hyperthermia Ambulates independently RN reviewed prep instructions and explained time frames for holding certain medications RN answered patient questions; patient stated understanding Prep instructions sent via My Chart per pt request

## 2024-08-02 ENCOUNTER — Encounter: Admitting: Pediatrics
